# Patient Record
Sex: Female | Born: 1945 | Hispanic: No | Smoking: Former smoker
Health system: Southern US, Community
[De-identification: ages and names within clinical notes are randomized; demographics above are authoritative.]

## PROBLEM LIST (undated history)

## (undated) DIAGNOSIS — R011 Cardiac murmur, unspecified: Secondary | ICD-10-CM

## (undated) DIAGNOSIS — K219 Gastro-esophageal reflux disease without esophagitis: Secondary | ICD-10-CM

## (undated) DIAGNOSIS — K5790 Diverticulosis of intestine, part unspecified, without perforation or abscess without bleeding: Secondary | ICD-10-CM

## (undated) DIAGNOSIS — M26639 Articular disc disorder of temporomandibular joint, unspecified side: Secondary | ICD-10-CM

## (undated) DIAGNOSIS — C801 Malignant (primary) neoplasm, unspecified: Secondary | ICD-10-CM

## (undated) DIAGNOSIS — D649 Anemia, unspecified: Secondary | ICD-10-CM

## (undated) DIAGNOSIS — K589 Irritable bowel syndrome without diarrhea: Secondary | ICD-10-CM

## (undated) DIAGNOSIS — F419 Anxiety disorder, unspecified: Secondary | ICD-10-CM

## (undated) DIAGNOSIS — H269 Unspecified cataract: Secondary | ICD-10-CM

## (undated) HISTORY — PX: PAROTID GLAND TUMOR EXCISION: SHX5221

## (undated) HISTORY — PX: APPENDECTOMY: SHX54

## (undated) HISTORY — PX: SHOULDER ARTHROSCOPY: SHX128

## (undated) HISTORY — PX: TONSILLECTOMY: SUR1361

## (undated) HISTORY — PX: ABDOMINAL HYSTERECTOMY: SHX81

## (undated) HISTORY — PX: OOPHORECTOMY: SHX86

## (undated) HISTORY — DX: Cardiac murmur, unspecified: R01.1

## (undated) HISTORY — PX: CHOLECYSTECTOMY: SHX55

## (undated) HISTORY — PX: KNEE ARTHROSCOPY: SHX127

## (undated) HISTORY — DX: Irritable bowel syndrome, unspecified: K58.9

## (undated) HISTORY — DX: Anemia, unspecified: D64.9

## (undated) HISTORY — DX: Unspecified cataract: H26.9

## (undated) HISTORY — DX: Gastro-esophageal reflux disease without esophagitis: K21.9

## (undated) HISTORY — PX: COLONOSCOPY: SHX174

## (undated) HISTORY — DX: Diverticulosis of intestine, part unspecified, without perforation or abscess without bleeding: K57.90

---

## 2005-04-25 HISTORY — PX: ESOPHAGOGASTRODUODENOSCOPY: SHX1529

## 2015-05-08 DIAGNOSIS — H905 Unspecified sensorineural hearing loss: Secondary | ICD-10-CM | POA: Insufficient documentation

## 2015-05-08 DIAGNOSIS — E040 Nontoxic diffuse goiter: Secondary | ICD-10-CM | POA: Insufficient documentation

## 2015-05-08 DIAGNOSIS — F411 Generalized anxiety disorder: Secondary | ICD-10-CM | POA: Diagnosis present

## 2015-10-01 ENCOUNTER — Encounter (HOSPITAL_BASED_OUTPATIENT_CLINIC_OR_DEPARTMENT_OTHER): Payer: Self-pay | Admitting: *Deleted

## 2015-10-02 ENCOUNTER — Other Ambulatory Visit: Payer: Self-pay | Admitting: Orthopedic Surgery

## 2015-10-06 ENCOUNTER — Encounter (HOSPITAL_BASED_OUTPATIENT_CLINIC_OR_DEPARTMENT_OTHER)
Admission: RE | Disposition: A | Discharge status: Home / Self Care | Payer: Self-pay | Source: Ambulatory Visit | Attending: Orthopedic Surgery

## 2015-10-06 ENCOUNTER — Ambulatory Visit (HOSPITAL_BASED_OUTPATIENT_CLINIC_OR_DEPARTMENT_OTHER): Payer: MEDICARE | Admitting: Anesthesiology

## 2015-10-06 ENCOUNTER — Ambulatory Visit (HOSPITAL_BASED_OUTPATIENT_CLINIC_OR_DEPARTMENT_OTHER)
Admission: RE | Admit: 2015-10-06 | Discharge: 2015-10-06 | Disposition: A | Discharge status: Home / Self Care | Payer: MEDICARE | Source: Ambulatory Visit | Attending: Orthopedic Surgery | Admitting: Orthopedic Surgery

## 2015-10-06 ENCOUNTER — Encounter (HOSPITAL_BASED_OUTPATIENT_CLINIC_OR_DEPARTMENT_OTHER): Payer: Self-pay | Admitting: Certified Registered"

## 2015-10-06 DIAGNOSIS — M19041 Primary osteoarthritis, right hand: Secondary | ICD-10-CM | POA: Insufficient documentation

## 2015-10-06 DIAGNOSIS — F419 Anxiety disorder, unspecified: Secondary | ICD-10-CM | POA: Insufficient documentation

## 2015-10-06 DIAGNOSIS — M67441 Ganglion, right hand: Secondary | ICD-10-CM | POA: Diagnosis not present

## 2015-10-06 DIAGNOSIS — Z79899 Other long term (current) drug therapy: Secondary | ICD-10-CM | POA: Insufficient documentation

## 2015-10-06 HISTORY — DX: Anxiety disorder, unspecified: F41.9

## 2015-10-06 HISTORY — PX: CYST EXCISION: SHX5701

## 2015-10-06 HISTORY — DX: Malignant (primary) neoplasm, unspecified: C80.1

## 2015-10-06 HISTORY — DX: Articular disc disorder of temporomandibular joint, unspecified side: M26.639

## 2015-10-06 SURGERY — CYST REMOVAL
Anesthesia: Regional | Site: Finger | Laterality: Right

## 2015-10-06 MED ORDER — MIDAZOLAM HCL 2 MG/2ML IJ SOLN: 1.0000 mg | INTRAMUSCULAR | Status: DC | PRN

## 2015-10-06 MED ORDER — CEFAZOLIN SODIUM-DEXTROSE 2-4 GM/100ML-% IV SOLN
INTRAVENOUS | Status: AC
  Filled 2015-10-06: qty 100

## 2015-10-06 MED ORDER — SCOPOLAMINE 1 MG/3DAYS TD PT72: 1.0000 | MEDICATED_PATCH | Freq: Once | TRANSDERMAL | Status: DC | PRN

## 2015-10-06 MED ORDER — LACTATED RINGERS IV SOLN
INTRAVENOUS | Status: DC
  Administered 2015-10-06: 13:00:00 via INTRAVENOUS

## 2015-10-06 MED ORDER — HYDROCODONE-ACETAMINOPHEN 5-325 MG PO TABS: ORAL_TABLET | ORAL | Status: DC

## 2015-10-06 MED ORDER — CHLORHEXIDINE GLUCONATE 4 % EX LIQD: 60.0000 mL | Freq: Once | CUTANEOUS | Status: DC

## 2015-10-06 MED ORDER — FENTANYL CITRATE (PF) 100 MCG/2ML IJ SOLN
INTRAMUSCULAR | Status: AC
  Filled 2015-10-06: qty 2

## 2015-10-06 MED ORDER — LIDOCAINE HCL (PF) 0.5 % IJ SOLN
INTRAMUSCULAR | Status: DC | PRN
Start: 2015-10-06 — End: 2015-10-06
  Administered 2015-10-06: 30 mL via INTRAVENOUS

## 2015-10-06 MED ORDER — CEFAZOLIN SODIUM-DEXTROSE 2-4 GM/100ML-% IV SOLN
2.0000 g | INTRAVENOUS | Status: AC
  Administered 2015-10-06: 2 g via INTRAVENOUS

## 2015-10-06 MED ORDER — LIDOCAINE HCL (CARDIAC) 20 MG/ML IV SOLN
INTRAVENOUS | Status: DC | PRN
  Administered 2015-10-06: 30 mg via INTRAVENOUS

## 2015-10-06 MED ORDER — PROPOFOL 500 MG/50ML IV EMUL
INTRAVENOUS | Status: DC | PRN
  Administered 2015-10-06: 75 ug/kg/min via INTRAVENOUS

## 2015-10-06 MED ORDER — FENTANYL CITRATE (PF) 100 MCG/2ML IJ SOLN
50.0000 ug | INTRAMUSCULAR | Status: DC | PRN
  Administered 2015-10-06: 50 ug via INTRAVENOUS

## 2015-10-06 MED ORDER — GLYCOPYRROLATE 0.2 MG/ML IJ SOLN: 0.2000 mg | Freq: Once | INTRAMUSCULAR | Status: DC | PRN

## 2015-10-06 MED ORDER — BUPIVACAINE HCL (PF) 0.25 % IJ SOLN
INTRAMUSCULAR | Status: DC | PRN
  Administered 2015-10-06: 8 mL

## 2015-10-06 MED ORDER — PROPOFOL 10 MG/ML IV BOLUS
INTRAVENOUS | Status: AC
  Filled 2015-10-06: qty 20

## 2015-10-06 SURGICAL SUPPLY — 50 items
BANDAGE ACE 3X5.8 VEL STRL LF (GAUZE/BANDAGES/DRESSINGS) IMPLANT
BANDAGE COBAN STERILE 2 (GAUZE/BANDAGES/DRESSINGS) IMPLANT
BENZOIN TINCTURE PRP APPL 2/3 (GAUZE/BANDAGES/DRESSINGS) IMPLANT
BLADE MINI RND TIP GREEN BEAV (BLADE) IMPLANT
BLADE SURG 15 STRL LF DISP TIS (BLADE) ×2 IMPLANT
BLADE SURG 15 STRL SS (BLADE) ×4
BNDG COHESIVE 1X5 TAN STRL LF (GAUZE/BANDAGES/DRESSINGS) ×3 IMPLANT
BNDG CONFORM 2 STRL LF (GAUZE/BANDAGES/DRESSINGS) IMPLANT
BNDG ELASTIC 2X5.8 VLCR STR LF (GAUZE/BANDAGES/DRESSINGS) IMPLANT
BNDG ESMARK 4X9 LF (GAUZE/BANDAGES/DRESSINGS) IMPLANT
BNDG GAUZE 1X2.1 STRL (MISCELLANEOUS) IMPLANT
BNDG GAUZE ELAST 4 BULKY (GAUZE/BANDAGES/DRESSINGS) IMPLANT
BNDG PLASTER X FAST 3X3 WHT LF (CAST SUPPLIES) IMPLANT
CHLORAPREP W/TINT 26ML (MISCELLANEOUS) ×3 IMPLANT
CLOSURE WOUND 1/2 X4 (GAUZE/BANDAGES/DRESSINGS)
CORDS BIPOLAR (ELECTRODE) ×3 IMPLANT
COVER BACK TABLE 60X90IN (DRAPES) ×3 IMPLANT
COVER MAYO STAND STRL (DRAPES) ×3 IMPLANT
CUFF TOURNIQUET SINGLE 18IN (TOURNIQUET CUFF) ×3 IMPLANT
DRAPE EXTREMITY T 121X128X90 (DRAPE) ×3 IMPLANT
DRAPE SURG 17X23 STRL (DRAPES) ×3 IMPLANT
GAUZE SPONGE 4X4 12PLY STRL (GAUZE/BANDAGES/DRESSINGS) ×3 IMPLANT
GAUZE XEROFORM 1X8 LF (GAUZE/BANDAGES/DRESSINGS) ×3 IMPLANT
GLOVE BIO SURGEON STRL SZ 6.5 (GLOVE) ×2 IMPLANT
GLOVE BIO SURGEON STRL SZ7.5 (GLOVE) ×3 IMPLANT
GLOVE BIO SURGEONS STRL SZ 6.5 (GLOVE) ×1
GLOVE BIOGEL PI IND STRL 7.0 (GLOVE) ×1 IMPLANT
GLOVE BIOGEL PI IND STRL 8 (GLOVE) ×1 IMPLANT
GLOVE BIOGEL PI INDICATOR 7.0 (GLOVE) ×2
GLOVE BIOGEL PI INDICATOR 8 (GLOVE) ×2
GOWN STRL REUS W/ TWL LRG LVL3 (GOWN DISPOSABLE) ×1 IMPLANT
GOWN STRL REUS W/TWL LRG LVL3 (GOWN DISPOSABLE) ×2
NEEDLE HYPO 25X1 1.5 SAFETY (NEEDLE) ×3 IMPLANT
NS IRRIG 1000ML POUR BTL (IV SOLUTION) ×3 IMPLANT
PACK BASIN DAY SURGERY FS (CUSTOM PROCEDURE TRAY) ×3 IMPLANT
PAD CAST 3X4 CTTN HI CHSV (CAST SUPPLIES) IMPLANT
PAD CAST 4YDX4 CTTN HI CHSV (CAST SUPPLIES) IMPLANT
PADDING CAST ABS 4INX4YD NS (CAST SUPPLIES)
PADDING CAST ABS COTTON 4X4 ST (CAST SUPPLIES) IMPLANT
PADDING CAST COTTON 3X4 STRL (CAST SUPPLIES)
PADDING CAST COTTON 4X4 STRL (CAST SUPPLIES)
SPLINT FINGER 3.25 911903 (SOFTGOODS) ×3 IMPLANT
STOCKINETTE 4X48 STRL (DRAPES) ×3 IMPLANT
STRIP CLOSURE SKIN 1/2X4 (GAUZE/BANDAGES/DRESSINGS) IMPLANT
SUT ETHILON 3 0 PS 1 (SUTURE) IMPLANT
SUT ETHILON 4 0 PS 2 18 (SUTURE) ×3 IMPLANT
SYR BULB 3OZ (MISCELLANEOUS) ×3 IMPLANT
SYR CONTROL 10ML LL (SYRINGE) ×3 IMPLANT
TOWEL OR 17X24 6PK STRL BLUE (TOWEL DISPOSABLE) ×3 IMPLANT
UNDERPAD 30X30 (UNDERPADS AND DIAPERS) ×3 IMPLANT

## 2015-10-06 NOTE — Anesthesia Postprocedure Evaluation (Signed)
Anesthesia Post Note  Patient: Alexandria Chapman  Procedure(s) Performed: Procedure(s) (LRB): RIGHT LONG FINGER EXCISION MUCOID CYST (Right)  Patient location during evaluation: PACU Anesthesia Type: General Level of consciousness: awake and alert Pain management: pain level controlled Vital Signs Assessment: post-procedure vital signs reviewed and stable Respiratory status: spontaneous breathing, nonlabored ventilation and respiratory function stable Cardiovascular status: blood pressure returned to baseline and stable Postop Assessment: no signs of nausea or vomiting Anesthetic complications: no    Last Vitals:  Filed Vitals:   10/06/15 1455 10/06/15 1505  BP: 146/71 148/73  Pulse: 48   Temp:  36.4 C  Resp: 14 16    Last Pain: There were no vitals filed for this visit.               Gavriela Cashin A

## 2015-10-06 NOTE — Discharge Instructions (Addendum)

## 2015-10-06 NOTE — Transfer of Care (Signed)
Immediate Anesthesia Transfer of Care Note  Patient: Alexandria Chapman  Procedure(s) Performed: Procedure(s): RIGHT LONG FINGER EXCISION MUCOID CYST (Right)  Patient Location: PACU  Anesthesia Type:Bier block  Level of Consciousness: awake, alert , oriented and patient cooperative  Airway & Oxygen Therapy: Patient Spontanous Breathing and Patient connected to face mask oxygen  Post-op Assessment: Report given to RN and Post -op Vital signs reviewed and stable  Post vital signs: Reviewed and stable  Last Vitals:  Filed Vitals:   10/06/15 1228  BP: 129/67  Pulse: 64  Temp: 36.8 C  Resp: 16    Last Pain: There were no vitals filed for this visit.    Patients Stated Pain Goal: 2 ([id number])  Complications: No apparent anesthesia complications

## 2015-10-06 NOTE — Anesthesia Procedure Notes (Addendum)
Anesthesia Regional Block:  Bier block (IV Regional)  Pre-Anesthetic Checklist: ,, timeout performed, Correct Patient, Correct Site, Correct Laterality, Correct Procedure,, site marked, surgical consent,, at surgeon's request Needles:  Injection technique: Single-shot  Needle Type: Other      Needle Gauge: 20 and 20 G    Additional Needles: Bier block (IV Regional) Narrative:   Performed by: Personally    Procedure Name: MAC Date/Time: 10/06/2015 1:45 PM Performed by: Kannon Granderson D Pre-anesthesia Checklist: Patient identified, Emergency Drugs available, Suction available, Patient being monitored and Timeout performed Patient Re-evaluated:Patient Re-evaluated prior to inductionOxygen Delivery Method: Simple face mask

## 2015-10-06 NOTE — Brief Op Note (Signed)
10/06/2015  2:28 PM  PATIENT:  Tiajuana Amass  70 y.o. 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  PROCEDURE:  Procedure(s): RIGHT LONG FINGER EXCISION MUCOID CYST (Right)  SURGEON:  Surgeon(s) and Role:    * Leanora Cover, MD - Primary  PHYSICIAN ASSISTANT:   ASSISTANTS: none   ANESTHESIA:   Bier block with sedation  EBL:  Total I/O In: 600 [I.V.:600] Out: -   BLOOD ADMINISTERED:none  DRAINS: none   LOCAL MEDICATIONS USED:  MARCAINE     SPECIMEN:  Source of Specimen:  right long finger  DISPOSITION OF SPECIMEN:  PATHOLOGY  COUNTS:  YES  TOURNIQUET:   Total Tourniquet Time Documented: Upper Arm (Right) - 34 minutes Total: Upper Arm (Right) - 34 minutes   DICTATION: .Other Dictation: Dictation Number [id number]  PLAN OF CARE: Discharge to home after PACU  PATIENT DISPOSITION:  PACU - hemodynamically stable.

## 2015-10-06 NOTE — H&P (Signed)
  Alexandria Chapman is an 70 y.o. female.   Chief Complaint: right long finger mucoid cyst HPI: 70 yo female with cyst on right long finger x 3 years.  It is bothersome to her.  It has gotten larger and caused a groove in her nail.  She wishes to have it removed.  Allergies:  Allergies  Allergen Reactions  . Sulfa Antibiotics Anaphylaxis  . Erythromycin   . Nsaids Nausea And Vomiting  . Sucralfate Nausea Only  . Sudafed [Pseudoephedrine Hcl] Other (See Comments)    dizziness    Past Medical History  Diagnosis Date  . Cancer ([place])     RLE squamous cell removed  . Anxiety   . TMJ disorder involving articular disc abnormality     Past Surgical History  Procedure Laterality Date  . Cholecystectomy    . Abdominal hysterectomy    . Appendectomy    . Tonsillectomy    . Shoulder arthroscopy    . Oophorectomy    . Knee arthroscopy      Family History: History reviewed. No pertinent family history.  Social History:   reports that she has never smoked. She has never used smokeless tobacco. She reports that she drinks alcohol. She reports that she does not use illicit drugs.  Medications: Medications Prior to Admission  Medication Sig Dispense Refill  . Biotin 5 MG CAPS Take by mouth.    . cholecalciferol (VITAMIN D) 1000 units tablet Take 1,000 Units by mouth daily.    . clonazePAM (KLONOPIN) 0.5 MG tablet Take 0.5 mg by mouth 2 (two) times daily as needed for anxiety.    Marland Kitchen co-enzyme Q-10 50 MG capsule Take 50 mg by mouth daily.    . OMEGA 3-6-9 FATTY ACIDS PO Take by mouth.    . Probiotic Product (ALIGN PO) Take by mouth.      No results found for this or any previous visit (from the past 48 hour(s)).  No results found.   A comprehensive review of systems was negative.  Blood pressure 129/67, pulse 64, temperature 98.2 F (36.8 C), temperature source Oral, resp. rate 16, height 5\' 6"  (1.676 m), weight 57.607 kg (127 lb), SpO2 100 %.  General appearance: alert,  cooperative and appears stated age Head: Normocephalic, without obvious abnormality, atraumatic Neck: supple, symmetrical, trachea midline Resp: clear to auscultation bilaterally Cardio: regular rate and rhythm GI: non-tender Extremities: Intact sensation and capillary refill all digits.  +epl/fpl/io.  No wounds.  Pulses: 2+ and symmetric Skin: Skin color, texture, turgor normal. 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  Non operative and operative treatment options were discussed with the patient and patient wishes to proceed with operative treatment. Discussed possible need for rotation flap.  Risks, benefits, and alternatives of surgery were discussed and the patient agrees with the plan of care.   Locke Barrell R 10/06/2015, 1:36 PM

## 2015-10-06 NOTE — Op Note (Addendum)
Alexandria Chapman, Alexandria Chapman               ACCOUNT NO.:  [id number]  MEDICAL RECORD NO.:  [id number]  LOCATION:                                 FACILITY:  PHYSICIAN:  Leanora Cover, MD             DATE OF BIRTH:  DATE OF PROCEDURE:  10/06/2015 DATE OF DISCHARGE:                              OPERATIVE REPORT   PREOPERATIVE DIAGNOSIS:  Right long finger mucoid cyst and DIP joint arthritis.  POSTOPERATIVE DIAGNOSIS:  Right long finger mucoid cyst and DIP joint arthritis.  PROCEDURE:   1. Right long finger excision of mucoid cyst 2. Right long finger debridement of DIP joint including osteophytes from dorsal aspect of the distal phalanx 3. Right long finger rotation flap less than 10 square cm for soft tissue coverage.  SURGEON:  Leanora Cover, MD.  ASSISTANT:  None.  ANESTHESIA:  Bier block with sedation.  IV FLUIDS:  Per anesthesia flow sheet.  ESTIMATED BLOOD LOSS:  Minimal.  COMPLICATIONS:  None.  SPECIMENS:  Mucoid cyst to Pathology.  TOURNIQUET TIME:  34 minutes.  DISPOSITION:  Stable to PACU.  INDICATIONS FOR PROCEDURE:  The patient is a 70 year old who has had a mass on her right long finger for approximately 2-3 years.  This is bothersome to her.  She wished to have it removed.  Risks, benefits, and alternatives of the surgery were discussed including the risk of blood loss; infection; damage to nerves, vessels, tendons, ligaments, bone; failure of surgery; need for additional surgery; complications with wound healing, continued pain, recurrence of mass.  She voiced understanding of these risks and elected to proceed.  OPERATIVE COURSE:  After being identified preoperatively by myself, the patient and I agreed upon the procedure and site of the procedure. Surgical site was marked.  The risks, benefits, and alternatives of surgery were reviewed, and she wished to proceed.  Surgical consent had been signed.  We discussed the possible need for rotation flap for  skin coverage.  She was given IV Ancef as preoperative antibiotic prophylaxis.  She was transferred to the operating room and placed on the operating table in supine position with the right upper extremity on arm board.  Bier block anesthesia was induced by Anesthesiology.  Right upper extremity was prepped and draped in normal sterile orthopedic fashion.  Surgical pause was performed between surgeons, Anesthesia, operating staff; and all were in agreement as to the patient, procedure, and site of procedure.  Tourniquet at the proximal aspect of the forearm had been inflated for the Bier block.  A hockey stick shaped incision was made at the dorsum of the long finger just proximal to the cyst. This was carried into subcutaneous tissues by spreading technique.  The cyst was identified.  The area was full of clear gelatinous fluid.  The cyst and its wall were removed and were sent to Pathology for examination.  The DIP joint was entered underneath the extensor tendon. It was debrided with a synovectomy rongeur.  There was some osteophyte at the dorsal proximal aspect of the distal phalanx which was taken down.  This was removed.  The skin overlying the cyst was very thin  and dry and appeared nonviable.  It was felt a rotation flap was appropriate.  The skin incision was extended proximally and then transversely again.  The flap was then mobilized preserving the vascular inflow and outflow.  The flap was able to cover the area of the cyst. The skin distally was removed sharply with the knife to provide a good contour and fit of the rotation flap.  The wound and DIP joint were then copiously irrigated with sterile saline.  The skin edges were reapproximated with 4-0 nylon in a horizontal mattress fashion.  This provided good tension free re-apposition of the tissues.  A digital block was performed with 8 mL of 0.25% plain Marcaine to aid in postoperative analgesia.  The wound was dressed with  sterile Xeroform, 4x4s, and wrapped with Coban dressing lightly.  Alumafoam splint was placed and wrapped lightly with Coban dressing.  Tourniquet was deflated at 34 minutes.  Fingertips were pink with brisk capillary refill after deflation of the tourniquet.  Operative drapes were broken down.  The patient was awoken from anesthesia safely.  She was transferred back to stretcher and taken to PACU in stable condition.  I will see her back in the office in one week for postoperative followup.  I will give him Norco 5/325, 1-2 p.o. q.6 hours p.r.n. pain, dispensed #20.     Leanora Cover, MD     KK/MEDQ  D:  10/06/2015  T:  10/06/2015  Job:  [id number]  Addendum (10/08/15): size of flap added to note.

## 2015-10-06 NOTE — Anesthesia Preprocedure Evaluation (Signed)
Anesthesia Evaluation  Patient identified by MRN, date of birth, ID band Patient awake    Reviewed: Allergy & Precautions, NPO status , Patient's Chart, lab work & pertinent test results  Airway Mallampati: I  TM Distance: >3 FB Neck ROM: Full    Dental  (+) Teeth Intact, Dental Advisory Given   Pulmonary    breath sounds clear to auscultation       Cardiovascular  Rhythm:Regular Rate:Normal     Neuro/Psych    GI/Hepatic   Endo/Other    Renal/GU      Musculoskeletal   Abdominal   Peds  Hematology   Anesthesia Other Findings   Reproductive/Obstetrics                             Anesthesia Physical Anesthesia Plan  ASA: I  Anesthesia Plan: Bier Block   Post-op Pain Management:    Induction: Intravenous  Airway Management Planned: Simple Face Mask  Additional Equipment:   Intra-op Plan:   Post-operative Plan:   Informed Consent: I have reviewed the patients History and Physical, chart, labs and discussed the procedure including the risks, benefits and alternatives for the proposed anesthesia with the patient or authorized representative who has indicated his/her understanding and acceptance.   Dental advisory given  Plan Discussed with: CRNA, Anesthesiologist and Surgeon  Anesthesia Plan Comments:         Anesthesia Quick Evaluation

## 2015-10-06 NOTE — Op Note (Signed)
[id number] 

## 2015-10-07 ENCOUNTER — Encounter (HOSPITAL_BASED_OUTPATIENT_CLINIC_OR_DEPARTMENT_OTHER): Payer: Self-pay | Admitting: Orthopedic Surgery

## 2015-10-14 DIAGNOSIS — K5730 Diverticulosis of large intestine without perforation or abscess without bleeding: Secondary | ICD-10-CM | POA: Insufficient documentation

## 2016-11-10 DIAGNOSIS — K219 Gastro-esophageal reflux disease without esophagitis: Secondary | ICD-10-CM | POA: Diagnosis present

## 2016-12-28 ENCOUNTER — Encounter (HOSPITAL_COMMUNITY): Payer: Self-pay | Admitting: Psychiatry

## 2016-12-28 ENCOUNTER — Ambulatory Visit (INDEPENDENT_AMBULATORY_CARE_PROVIDER_SITE_OTHER): Payer: MEDICARE | Admitting: Psychiatry

## 2016-12-28 VITALS — BP 140/78 | HR 67 | Ht 65.0 in | Wt 125.2 lb

## 2016-12-28 DIAGNOSIS — Z91411 Personal history of adult psychological abuse: Secondary | ICD-10-CM

## 2016-12-28 DIAGNOSIS — F4310 Post-traumatic stress disorder, unspecified: Secondary | ICD-10-CM

## 2016-12-28 DIAGNOSIS — Z91410 Personal history of adult physical and sexual abuse: Secondary | ICD-10-CM

## 2016-12-28 DIAGNOSIS — Z62810 Personal history of physical and sexual abuse in childhood: Secondary | ICD-10-CM | POA: Diagnosis not present

## 2016-12-28 DIAGNOSIS — F5104 Psychophysiologic insomnia: Secondary | ICD-10-CM

## 2016-12-28 DIAGNOSIS — Z62811 Personal history of psychological abuse in childhood: Secondary | ICD-10-CM

## 2016-12-28 DIAGNOSIS — Z811 Family history of alcohol abuse and dependence: Secondary | ICD-10-CM | POA: Diagnosis not present

## 2016-12-28 DIAGNOSIS — Z818 Family history of other mental and behavioral disorders: Secondary | ICD-10-CM

## 2016-12-28 DIAGNOSIS — Z813 Family history of other psychoactive substance abuse and dependence: Secondary | ICD-10-CM | POA: Diagnosis not present
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
## 2016-12-28 NOTE — Patient Instructions (Signed)
Start melatonin 1 mg at sun-down for sleep  Taper clonazepam to 1/4 of a tablet for another week then stop

## 2017-01-05 ENCOUNTER — Telehealth (HOSPITAL_COMMUNITY): Payer: Self-pay

## 2017-01-05 NOTE — Telephone Encounter (Signed)
Patient is calling because she said she had a panic attack yesterday, she states she went to her PCP but she also took a 0.5 mg Klonopin. Patient states that she knows that you want her to stop the Klonopin, but wants to know if she can still take it when she suffers a panic attack. Please review and advise, thank you

## 2017-01-05 NOTE — Telephone Encounter (Signed)
Sure that is fine.

## 2017-01-30 ENCOUNTER — Telehealth (HOSPITAL_COMMUNITY): Payer: Self-pay

## 2017-01-30 MED ORDER — ESCITALOPRAM OXALATE 5 MG PO TABS: 5.0000 mg | ORAL_TABLET | Freq: Every day | ORAL | 2 refills | Status: DC

## 2017-01-30 NOTE — Telephone Encounter (Signed)
I sent the Lexapro to the pharmacy and called patient to let her know

## 2017-01-30 NOTE — Telephone Encounter (Signed)
I think she would be a good candidate for an SSRI.  Given her age, we can start fairly low. If she is interested in taking a daily medication that will help control anxiety and sleep issues, then we can start lexapro 5 mg daily.  I have never prescribed her klonipin and when she came to me, she was essentially finished in tapering it off.  I think she did the right thing for herself and I would like to help her avoid restarting clonazepam

## 2017-01-30 NOTE — Telephone Encounter (Signed)
Patient is calling because she needs to go back on her Klonopin - She suffered a panic attack right before going on vacation and her PCP gave her 10 tabs to get her through. She states that she only uses them for anxiety and now she is also having trouble sleeping. Please review and advise, thank you.  P.S. She has an appointment in December, it was your first available.

## 2017-02-15 DIAGNOSIS — K589 Irritable bowel syndrome without diarrhea: Secondary | ICD-10-CM

## 2017-02-15 DIAGNOSIS — K581 Irritable bowel syndrome with constipation: Secondary | ICD-10-CM

## 2017-02-22 ENCOUNTER — Other Ambulatory Visit (HOSPITAL_COMMUNITY): Payer: Self-pay

## 2017-02-22 MED ORDER — ESCITALOPRAM OXALATE 5 MG PO TABS: 5.0000 mg | ORAL_TABLET | Freq: Every day | ORAL | 0 refills | Status: DC

## 2017-03-28 ENCOUNTER — Encounter (HOSPITAL_COMMUNITY): Payer: Self-pay | Admitting: Psychiatry

## 2017-03-28 ENCOUNTER — Ambulatory Visit (INDEPENDENT_AMBULATORY_CARE_PROVIDER_SITE_OTHER): Payer: MEDICARE | Admitting: Psychiatry

## 2017-03-28 VITALS — BP 122/74 | HR 59 | Ht 66.0 in | Wt 128.4 lb

## 2017-03-28 DIAGNOSIS — Z813 Family history of other psychoactive substance abuse and dependence: Secondary | ICD-10-CM | POA: Diagnosis not present

## 2017-03-28 DIAGNOSIS — Z79899 Other long term (current) drug therapy: Secondary | ICD-10-CM | POA: Diagnosis not present

## 2017-03-28 DIAGNOSIS — Z811 Family history of alcohol abuse and dependence: Secondary | ICD-10-CM

## 2017-03-28 DIAGNOSIS — Z818 Family history of other mental and behavioral disorders: Secondary | ICD-10-CM | POA: Diagnosis not present

## 2017-03-28 DIAGNOSIS — F339 Major depressive disorder, recurrent, unspecified: Secondary | ICD-10-CM

## 2017-03-28 MED ORDER — VORTIOXETINE HBR 5 MG PO TABS: 5.0000 mg | ORAL_TABLET | Freq: Every day | ORAL | 3 refills | Status: DC

## 2017-03-28 NOTE — Progress Notes (Signed)
[facility] MD/PA/NP OP Progress Note  03/28/2017 10:57 AM Alexandria Chapman  MRN:  [id number]  Chief Complaint: Much better HPI: Patient reports that she is not been using clonazepam at all and is sleeping very well, Lexapro has been substantially beneficial for her anxiety and mood, she feels more cheerful, better able to tolerate frustration.  The one concern she has is related to feeling more inattentive and like her thinking is a bit slower.  We discussed switching her to vortioxetine given the approval for depression and cognitive processing speed.  Reviewed the risks of nausea and GI upset, and encouraged her to take the medicine with food.  We agreed to start at 5 mg daily and maintain it.  We will follow-up in 3 months or sooner if needed.   Visit Diagnosis:    ICD-10-CM   1. Recurrent major depressive disorder, remission status unspecified (HCC) F33.9 vortioxetine HBr (TRINTELLIX) 5 MG TABS    Past Psychiatric History: See intake H&P for full details. Reviewed, with no updates at this time.   Past Medical History:  Past Medical History:  Diagnosis Date  . Anxiety   . 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 TMJ disorder involving articular disc abnormality     Past Surgical History:  Procedure Laterality Date  . ABDOMINAL HYSTERECTOMY    . APPENDECTOMY    . CHOLECYSTECTOMY    . CYST EXCISION Right 10/06/2015   Procedure: RIGHT LONG FINGER EXCISION MUCOID CYST;  Surgeon: Leanora Cover, MD;  Location: [facility];  Service: Orthopedics;  Laterality: Right;  Bier block  . KNEE ARTHROSCOPY    . OOPHORECTOMY    . SHOULDER ARTHROSCOPY    . TONSILLECTOMY      Family Psychiatric History: See intake H&P for full details. Reviewed, with no updates at this time.   Family History:  Family History  Problem Relation Age of Onset  . Alcohol abuse Mother   . Bipolar disorder Mother   . Alcohol abuse Maternal Uncle   . Drug abuse Cousin   . OCD Other     Social History:   Social History   Socioeconomic History  . Marital status: Widowed    Spouse name: None  . Number of children: None  . Years of education: None  . Highest education level: None  Social Needs  . Financial resource strain: None  . Food insecurity - worry: None  . Food insecurity - inability: None  . Transportation needs - medical: None  . Transportation needs - non-medical: None  Occupational History  . None  Tobacco Use  . Smoking status: Never Smoker  . Smokeless tobacco: Never Used  Substance and Sexual Activity  . Alcohol use: Yes    Comment: occas  . Drug use: No  . Sexual activity: No  Other Topics Concern  . None  Social History Narrative  . None    Allergies:  Allergies  Allergen Reactions  . Sulfa Antibiotics Anaphylaxis  . Align [Acidophilus] Other (See Comments)    Constipation and gas  . Erythromycin   . Nsaids Nausea And Vomiting  . Sucralfate Nausea Only  . Sudafed [Pseudoephedrine Hcl] Other (See Comments)    dizziness    Metabolic Disorder Labs: No results found for: HGBA1C, MPG No results found for: PROLACTIN No results found for: CHOL, TRIG, HDL, CHOLHDL, VLDL, LDLCALC No results found for: TSH  Therapeutic Level Labs: No results found for: LITHIUM No results found for: VALPROATE No components  found for:  CBMZ  Current Medications: Current Outpatient Medications  Medication Sig Dispense Refill  . ALOE VERA JUICE LIQD Take 2 oz by mouth.    . Biotin 5 MG CAPS Take by mouth.    . Calcium-Magnesium-Vitamin D (CALCIUM 500 PO) Take 500 mg by mouth.    . cholecalciferol (VITAMIN D) 1000 units tablet Take 1,000 Units by mouth daily.    Marland Kitchen co-enzyme Q-10 50 MG capsule Take 50 mg by mouth daily.    . COD LIVER OIL PO Take 228 mg by mouth.    . cyanocobalamin 500 MCG tablet Take 500 mcg by mouth daily.    . Magnesium 250 MG TABS Take 250 mg by mouth.    . OMEGA 3-6-9 FATTY ACIDS PO Take by mouth.    . pantoprazole (PROTONIX) 40 MG tablet Take 40  mg by mouth daily.    . polyethylene glycol (MIRALAX / GLYCOLAX) packet Take 17 g by mouth daily.    . Probiotic Product (ALIGN PO) Take by mouth.    . vortioxetine HBr (TRINTELLIX) 5 MG TABS Take 1 tablet (5 mg total) by mouth daily. 30 tablet 3   No current facility-administered medications for this visit.      Musculoskeletal: Strength & Muscle Tone: within normal limits Gait & Station: normal Patient leans: N/A  Psychiatric Specialty Exam: ROS  Blood pressure 122/74, pulse (!) 59, height 5\' 6"  (1.676 m), weight 128 lb 6.4 oz (58.2 kg).Body mass index is 20.72 kg/m.  General Appearance: Casual and Well Groomed  Eye Contact:  Good  Speech:  Clear and Coherent  Volume:  Normal  Mood:  Euthymic  Affect:  Appropriate and Congruent  Thought Process:  Coherent, Goal Directed and Descriptions of Associations: Intact  Orientation:  Full (Time, Place, and Person)  Thought Content: Logical   Suicidal Thoughts:  No  Homicidal Thoughts:  No  Memory:  Immediate;   Good  Judgement:  Good  Insight:  Good  Psychomotor Activity:  Normal  Concentration:  Concentration: Fair  Recall:  Good  Fund of Knowledge: Good  Language: Good  Akathisia:  Negative  Handed:  Right  AIMS (if indicated): not done  Assets:  Communication Skills Desire for Improvement Financial Resources/Insurance Housing Leisure Time [place] Talents/Skills Transportation Vocational/Educational  ADL's:  Intact  Cognition: WNL  Sleep:  Good   Screenings:   Assessment and Plan:  Alexandria Chapman presents with general remission of her symptoms on Lexapro 5 mg daily, except for some mild worsening of inattentive symptoms in feeling more cognitively sluggish.  We discussed a switch to vortioxetine 5 mg daily and will follow up in 3 months.  I encouraged her to take the medicine with food to reduce the risk of nausea.  She is not using any benzodiazepines and reports that her sleep  has improved substantially.  1. Recurrent major depressive disorder, remission status unspecified ([place])     Status of current problems: gradually improving  Labs Ordered: No orders of the defined types were placed in this encounter.   Labs Reviewed: N/A  Collateral Obtained/Records Reviewed: N/A  Plan:  Discontinue Lexapro Vortioxetine 5 mg daily Clonazepam no longer taken    I spent 15 minutes with the patient in direct face-to-face clinical care.  Greater than 50% of this time was spent in counseling and coordination of care with the patient.    Aundra Dubin, MD 03/28/2017, 10:57 AM

## 2017-06-16 DIAGNOSIS — M7541 Impingement syndrome of right shoulder: Secondary | ICD-10-CM | POA: Insufficient documentation

## 2017-06-26 ENCOUNTER — Ambulatory Visit (INDEPENDENT_AMBULATORY_CARE_PROVIDER_SITE_OTHER): Payer: MEDICARE | Admitting: Psychiatry

## 2017-06-26 ENCOUNTER — Encounter (HOSPITAL_COMMUNITY): Payer: Self-pay | Admitting: Psychiatry

## 2017-06-26 VITALS — BP 145/79 | HR 58 | Ht 66.0 in | Wt 134.0 lb

## 2017-06-26 DIAGNOSIS — Z811 Family history of alcohol abuse and dependence: Secondary | ICD-10-CM | POA: Diagnosis not present

## 2017-06-26 DIAGNOSIS — G2581 Restless legs syndrome: Secondary | ICD-10-CM | POA: Diagnosis not present

## 2017-06-26 DIAGNOSIS — Z813 Family history of other psychoactive substance abuse and dependence: Secondary | ICD-10-CM

## 2017-06-26 DIAGNOSIS — Z818 Family history of other mental and behavioral disorders: Secondary | ICD-10-CM

## 2017-06-26 DIAGNOSIS — R002 Palpitations: Secondary | ICD-10-CM | POA: Diagnosis not present

## 2017-06-26 DIAGNOSIS — F5101 Primary insomnia: Secondary | ICD-10-CM

## 2017-06-26 MED ORDER — ALPRAZOLAM 0.25 MG PO TABS: 0.2500 mg | ORAL_TABLET | Freq: Every evening | ORAL | 2 refills | Status: DC | PRN

## 2017-06-26 NOTE — Progress Notes (Signed)
Patient takes PRN

## 2017-06-26 NOTE — Progress Notes (Signed)
[facility] MD/PA/NP OP Progress Note  06/26/2017 11:15 AM Alexandria Chapman  MRN:  [id number]  Chief Complaint: Trintellix side effects HPI: Alexandria Chapman presents for med management follow-up.  She reports that Trintellix cause a lot of side effects for her, and she stopped after 5 weeks.  She had GI side effects, palpitations, insomnia, restless leg.  She reports that she can deal with the mood aspects, but she would primarily like to work on insomnia.  She feels like if she got a good night sleep, things during the day would be generally much easier to deal with.  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  Clonazepam has been effective in the past for sleep onset insomnia for her.  I spent time discussing the risks of clonazepam given its long acting profile.  I suggested we use a very short acting benzodiazepine such as Xanax which can be in an out of her system very quickly.  She was agreeable to a trial of this and understands the long-term risks of benzodiazepines in the elderly.  Educated her that this should not be taken during the day as it would still increase the risk of falling and confusion.  Visit Diagnosis:    ICD-10-CM   1. Primary insomnia F51.01 ALPRAZolam (XANAX) 0.25 MG tablet    Past Psychiatric History: See intake H&P for full details. Reviewed, with no updates at this time.   Past Medical History:  Past Medical History:  Diagnosis Date  . Anxiety   . 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 TMJ disorder involving articular disc abnormality     Past Surgical History:  Procedure Laterality Date  . ABDOMINAL HYSTERECTOMY    . APPENDECTOMY    . CHOLECYSTECTOMY    . CYST EXCISION Right 10/06/2015   Procedure: RIGHT LONG FINGER EXCISION MUCOID CYST;  Surgeon: Leanora Cover, MD;  Location: [facility];  Service: Orthopedics;  Laterality: Right;  Bier block  . KNEE ARTHROSCOPY    . OOPHORECTOMY    . SHOULDER ARTHROSCOPY    . TONSILLECTOMY      Family Psychiatric  History: See intake H&P for full details. Reviewed, with no updates at this time.   Family History:  Family History  Problem Relation Age of Onset  . Alcohol abuse Mother   . Bipolar disorder Mother   . Alcohol abuse Maternal Uncle   . Drug abuse Cousin   . OCD Other     Social History:  Social History   Socioeconomic History  . Marital status: Widowed    Spouse name: None  . Number of children: None  . Years of education: None  . Highest education level: None  Social Needs  . Financial resource strain: None  . Food insecurity - worry: None  . Food insecurity - inability: None  . Transportation needs - medical: None  . Transportation needs - non-medical: None  Occupational History  . None  Tobacco Use  . Smoking status: Never Smoker  . Smokeless tobacco: Never Used  Substance and Sexual Activity  . Alcohol use: Yes    Comment: occas  . Drug use: No  . Sexual activity: No  Other Topics Concern  . None  Social History Narrative  . None    Allergies:  Allergies  Allergen Reactions  . Sulfa Antibiotics Anaphylaxis  . Align [Acidophilus] Other (See Comments)    Constipation and gas  . Erythromycin   . Nsaids Nausea And Vomiting  . Sucralfate Nausea  Only  . Sudafed [Pseudoephedrine Hcl] Other (See Comments)    dizziness    Metabolic Disorder Labs: No results found for: HGBA1C, MPG No results found for: PROLACTIN No results found for: CHOL, TRIG, HDL, CHOLHDL, VLDL, LDLCALC No results found for: TSH  Therapeutic Level Labs: No results found for: LITHIUM No results found for: VALPROATE No components found for:  CBMZ  Current Medications: Current Outpatient Medications  Medication Sig Dispense Refill  . Calcium-Magnesium-Vitamin D (CALCIUM 500 PO) Take 500 mg by mouth.    . cholecalciferol (VITAMIN D) 1000 units tablet Take 1,000 Units by mouth daily.    . COD LIVER OIL PO Take 228 mg by mouth.    . Magnesium 250 MG TABS Take 250 mg by mouth.    .  OMEGA 3-6-9 FATTY ACIDS PO Take by mouth.    . polyethylene glycol (MIRALAX / GLYCOLAX) packet Take 17 g by mouth daily.    . Probiotic Product (ALIGN PO) Take by mouth.    . Selenium 200 MCG CAPS Take by mouth.    . ALOE VERA JUICE LIQD Take 2 oz by mouth.    . ALPRAZolam (XANAX) 0.25 MG tablet Take 1 tablet (0.25 mg total) by mouth at bedtime as needed for anxiety or sleep. 30 tablet 2  . Biotin 5 MG CAPS Take by mouth.    . co-enzyme Q-10 50 MG capsule Take 50 mg by mouth daily.    . cyanocobalamin 500 MCG tablet Take 500 mcg by mouth daily.    . pantoprazole (PROTONIX) 40 MG tablet Take 40 mg by mouth daily.     No current facility-administered medications for this visit.      Musculoskeletal: Strength & Muscle Tone: within normal limits Gait & Station: normal Patient leans: N/A  Psychiatric Specialty Exam: ROS  Blood pressure (!) 145/79, pulse (!) 58, height 5\' 6"  (1.676 m), weight 134 lb (60.8 kg).Body mass index is 21.63 kg/m.  General Appearance: Casual and Fairly Groomed  Eye Contact:  Good  Speech:  Clear and Coherent and Normal Rate  Volume:  Normal  Mood:  Euthymic  Affect:  Appropriate and Congruent  Thought Process:  Goal Directed and Descriptions of Associations: Intact  Orientation:  Full (Time, Place, and Person)  Thought Content: Logical   Suicidal Thoughts:  No  Homicidal Thoughts:  No  Memory:  Immediate;   Good  Judgement:  Good  Insight:  Good  Psychomotor Activity:  Normal  Concentration:  Concentration: Good  Recall:  Good  Fund of Knowledge: Good  Language: Good  Akathisia:  Negative  Handed:  Right  AIMS (if indicated): not done  Assets:  Communication Skills Desire for Improvement Financial Resources/Insurance Housing Leisure Time [place] Talents/Skills Transportation  ADL's:  Intact  Cognition: WNL  Sleep:  Good   Screenings:   Assessment and Plan:  Alexandria Chapman reports that her mood has  been okay even in the absence of Trintellix.  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  We discussed the use of a very short acting benzodiazepine, alprazolam, to address her sleep onset symptoms.  She has previously tried melatonin without benefit, and had previous benefit from clonazepam, but we both have concerns about her being on a long-acting benzodiazepine given the risk of falls.  She does not have any personal history of falling or difficulties with her gait, but she  understands that her age is a risk factor.  She does not present any acute safety concerns at this time.  We will follow-up in 3 months or sooner if needed.  1. Primary insomnia     Status of current problems: stable  Labs Ordered: No orders of the defined types were placed in this encounter.   Labs Reviewed: N/A  Collateral Obtained/Records Reviewed: N/A  Plan:  Alprazolam 0.25 mg nightly for sleep Return to clinic in 12 weeks  I spent 20 minutes with the patient in direct face-to-face clinical care.  Greater than 50% of this time was spent in counseling and coordination of care with the patient.    Aundra Dubin, MD 06/26/2017, 11:15 AM

## 2017-09-14 ENCOUNTER — Encounter (HOSPITAL_COMMUNITY): Payer: Self-pay | Admitting: Psychiatry

## 2017-09-14 ENCOUNTER — Ambulatory Visit (INDEPENDENT_AMBULATORY_CARE_PROVIDER_SITE_OTHER): Payer: MEDICARE | Admitting: Psychiatry

## 2017-09-14 DIAGNOSIS — Z818 Family history of other mental and behavioral disorders: Secondary | ICD-10-CM | POA: Diagnosis not present

## 2017-09-14 DIAGNOSIS — F5101 Primary insomnia: Secondary | ICD-10-CM | POA: Diagnosis not present

## 2017-09-14 DIAGNOSIS — Z811 Family history of alcohol abuse and dependence: Secondary | ICD-10-CM

## 2017-09-14 MED ORDER — ALPRAZOLAM 0.25 MG PO TABS: 0.2500 mg | ORAL_TABLET | Freq: Every evening | ORAL | 2 refills | Status: DC | PRN

## 2017-09-14 NOTE — Progress Notes (Signed)
[facility] MD/PA/NP OP Progress Note  09/14/2017 10:58 AM Alexandria Chapman  MRN:  [id number]  Chief Complaint: med management  HPI: Alexandria Chapman reports that her mood and anxiety have been doing fine, she is sleeping very well with the low-dose of alprazolam.  She has been making very positive improvements in her life and socializing more often, developing more meaningful relationships, connecting more with her son in [place].  She feels like things are on a very positive trajectory and this has largely to do with her steps that she has taken in being more vulnerable and open and honest with the individuals in her life.  No significant concerns about her medication or safety issues.  We will follow-up in 3 months, and she understands that writer is transitioning out of clinic at that time.  Visit Diagnosis:    ICD-10-CM   1. Primary insomnia F51.01 ALPRAZolam (XANAX) 0.25 MG tablet    Past Psychiatric History: See intake H&P for full details. Reviewed, with no updates at this time.  Past Medical History:  Past Medical History:  Diagnosis Date  . Anxiety   . 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 TMJ disorder involving articular disc abnormality     Past Surgical History:  Procedure Laterality Date  . ABDOMINAL HYSTERECTOMY    . APPENDECTOMY    . CHOLECYSTECTOMY    . CYST EXCISION Right 10/06/2015   Procedure: RIGHT LONG FINGER EXCISION MUCOID CYST;  Surgeon: Leanora Cover, MD;  Location: [facility];  Service: Orthopedics;  Laterality: Right;  Bier block  . KNEE ARTHROSCOPY    . OOPHORECTOMY    . SHOULDER ARTHROSCOPY    . TONSILLECTOMY      Family Psychiatric History: See intake H&P for full details. Reviewed, with no updates at this time.   Family History:  Family History  Problem Relation Age of Onset  . Alcohol abuse Mother   . Bipolar disorder Mother   . Alcohol abuse Maternal Uncle   . Drug abuse Cousin   . OCD Other     Social History:  Social History    Socioeconomic History  . Marital status: Widowed    Spouse name: Not on file  . Number of children: Not on file  . Years of education: Not on file  . Highest education level: Not on file  Occupational History  . Not on file  Social Needs  . Financial resource strain: Not on file  . Food insecurity:    Worry: Not on file    Inability: Not on file  . Transportation needs:    Medical: Not on file    Non-medical: Not on file  Tobacco Use  . Smoking status: Never Smoker  . Smokeless tobacco: Never Used  Substance and Sexual Activity  . Alcohol use: Yes    Comment: occas  . Drug use: No  . Sexual activity: Never  Lifestyle  . Physical activity:    Days per week: Not on file    Minutes per session: Not on file  . Stress: Not on file  Relationships  . Social connections:    Talks on phone: Not on file    Gets together: Not on file    Attends religious service: Not on file    Active member of club or organization: Not on file    Attends meetings of clubs or organizations: Not on file    Relationship status: Not on file  Other Topics Concern  .  Not on file  Social History Narrative  . Not on file    Allergies:  Allergies  Allergen Reactions  . Sulfa Antibiotics Anaphylaxis  . Align [Acidophilus] Other (See Comments)    Constipation and gas  . Erythromycin   . Nsaids Nausea And Vomiting  . Sucralfate Nausea Only  . Sudafed [Pseudoephedrine Hcl] Other (See Comments)    dizziness    Metabolic Disorder Labs: No results found for: HGBA1C, MPG No results found for: PROLACTIN No results found for: CHOL, TRIG, HDL, CHOLHDL, VLDL, LDLCALC No results found for: TSH  Therapeutic Level Labs: No results found for: LITHIUM No results found for: VALPROATE No components found for:  CBMZ  Current Medications: Current Outpatient Medications  Medication Sig Dispense Refill  . ALPRAZolam (XANAX) 0.25 MG tablet Take 1 tablet (0.25 mg total) by mouth at bedtime as needed for  anxiety or sleep. 30 tablet 2  . Calcium-Magnesium-Vitamin D (CALCIUM 500 PO) Take 500 mg by mouth.    . cholecalciferol (VITAMIN D) 1000 units tablet Take 1,000 Units by mouth daily.    Marland Kitchen co-enzyme Q-10 50 MG capsule Take 50 mg by mouth daily.    . COD LIVER OIL PO Take 228 mg by mouth.    . cyanocobalamin 500 MCG tablet Take 500 mcg by mouth daily.    Marland Kitchen dicyclomine (BENTYL) 20 MG tablet Take 20 mg by mouth 1 day or 1 dose.    . Magnesium 250 MG TABS Take 250 mg by mouth.    . OMEGA 3-6-9 FATTY ACIDS PO Take by mouth.    . polyethylene glycol (MIRALAX / GLYCOLAX) packet Take 17 g by mouth daily.    . Probiotic Product (ALIGN PO) Take by mouth.    . ALOE VERA JUICE LIQD Take 2 oz by mouth.    . Biotin 5 MG CAPS Take by mouth.    . pantoprazole (PROTONIX) 40 MG tablet Take 40 mg by mouth daily.    . Selenium 200 MCG CAPS Take by mouth.     No current facility-administered medications for this visit.    Musculoskeletal: Strength & Muscle Tone: within normal limits Gait & Station: normal Patient leans: N/A  Psychiatric Specialty Exam: ROS  Blood pressure (!) 144/73, pulse (!) 58, height 5\' 6"  (1.676 m), weight 131 lb (59.4 kg), SpO2 98 %.Body mass index is 21.14 kg/m.  General Appearance: Casual and Well Groomed  Eye Contact:  Good  Speech:  Clear and Coherent and Normal Rate  Volume:  Normal  Mood:  Euthymic  Affect:  Appropriate and Congruent  Thought Process:  Goal Directed and Descriptions of Associations: Intact  Orientation:  Full (Time, Place, and Person)  Thought Content: Logical   Suicidal Thoughts:  No  Homicidal Thoughts:  No  Memory:  Immediate;   Fair  Judgement:  Fair  Insight:  Fair  Psychomotor Activity:  Normal  Concentration:  Concentration: Good  Recall:  Good  Fund of Knowledge: Good  Language: Good  Akathisia:  Negative  Handed:  Right  AIMS (if indicated): not done  Assets:  Communication Skills Desire for Improvement Financial  Resources/Insurance Housing  ADL's:  Intact  Cognition: WNL  Sleep:  Good   Screenings:    Assessment and Plan: Audryanna Zurita has had good results with a low-dose of Xanax nightly for sleep.  No concerns about education misuse, overall  feels that her memory and energy, and mood are at a good place.  We will continue as below  and follow-up in 3 months.  Disclosed to patient that Probation officer is leaving practice at the end of August, and we discussed the transition of care at that time.  Given stability on her current medication regimen, and the very low-dose of Xanax, she may be appropriate to follow-up with her primary care provider for refills and check ins every 3-4 months.  1. Primary insomnia     Status of current problems: stable  Labs Ordered: No orders of the defined types were placed in this encounter.   Labs Reviewed: n/a  Collateral Obtained/Records Reviewed: n/a  Plan:  Continue xanax 0.25 mg nightly rtc 3 months   I spent 20 minutes with the patient in direct face-to-face clinical care.  Greater than 50% of this time was spent in counseling and coordination of care with the patient.    Aundra Dubin, MD 09/14/2017, 10:58 AM

## 2017-10-29 ENCOUNTER — Inpatient Hospital Stay (HOSPITAL_BASED_OUTPATIENT_CLINIC_OR_DEPARTMENT_OTHER)
Admission: EM | Admit: 2017-10-29 | Discharge: 2017-11-05 | DRG: 392 | Disposition: A | Discharge status: Home / Self Care | Payer: MEDICARE | Attending: Surgery | Admitting: Surgery

## 2017-10-29 ENCOUNTER — Encounter (HOSPITAL_BASED_OUTPATIENT_CLINIC_OR_DEPARTMENT_OTHER): Payer: Self-pay | Admitting: Emergency Medicine

## 2017-10-29 ENCOUNTER — Emergency Department (HOSPITAL_BASED_OUTPATIENT_CLINIC_OR_DEPARTMENT_OTHER): Payer: MEDICARE

## 2017-10-29 ENCOUNTER — Other Ambulatory Visit: Payer: Self-pay

## 2017-10-29 DIAGNOSIS — K5720 Diverticulitis of large intestine with perforation and abscess without bleeding: Secondary | ICD-10-CM | POA: Diagnosis not present

## 2017-10-29 DIAGNOSIS — Z90710 Acquired absence of both cervix and uterus: Secondary | ICD-10-CM | POA: Diagnosis not present

## 2017-10-29 DIAGNOSIS — K581 Irritable bowel syndrome with constipation: Secondary | ICD-10-CM | POA: Diagnosis present

## 2017-10-29 DIAGNOSIS — Z85828 Personal history of other malignant neoplasm of skin: Secondary | ICD-10-CM | POA: Diagnosis not present

## 2017-10-29 DIAGNOSIS — F419 Anxiety disorder, unspecified: Secondary | ICD-10-CM | POA: Diagnosis present

## 2017-10-29 DIAGNOSIS — F411 Generalized anxiety disorder: Secondary | ICD-10-CM | POA: Diagnosis present

## 2017-10-29 DIAGNOSIS — R350 Frequency of micturition: Secondary | ICD-10-CM | POA: Diagnosis present

## 2017-10-29 DIAGNOSIS — E876 Hypokalemia: Secondary | ICD-10-CM | POA: Diagnosis not present

## 2017-10-29 DIAGNOSIS — Z79899 Other long term (current) drug therapy: Secondary | ICD-10-CM | POA: Diagnosis not present

## 2017-10-29 DIAGNOSIS — K219 Gastro-esophageal reflux disease without esophagitis: Secondary | ICD-10-CM | POA: Diagnosis present

## 2017-10-29 DIAGNOSIS — R3915 Urgency of urination: Secondary | ICD-10-CM | POA: Diagnosis present

## 2017-10-29 DIAGNOSIS — K589 Irritable bowel syndrome without diarrhea: Secondary | ICD-10-CM

## 2017-10-29 DIAGNOSIS — K5792 Diverticulitis of intestine, part unspecified, without perforation or abscess without bleeding: Secondary | ICD-10-CM | POA: Diagnosis present

## 2017-10-29 DIAGNOSIS — Z888 Allergy status to other drugs, medicaments and biological substances status: Secondary | ICD-10-CM | POA: Diagnosis not present

## 2017-10-29 DIAGNOSIS — G4700 Insomnia, unspecified: Secondary | ICD-10-CM | POA: Diagnosis not present

## 2017-10-29 DIAGNOSIS — Z882 Allergy status to sulfonamides status: Secondary | ICD-10-CM

## 2017-10-29 LAB — CBC
HEMATOCRIT: 38.9 % (ref 36.0–46.0)
Hemoglobin: 13.1 g/dL (ref 12.0–15.0)
MCH: 28.9 pg (ref 26.0–34.0)
MCHC: 33.7 g/dL (ref 30.0–36.0)
MCV: 85.7 fL (ref 78.0–100.0)
Platelets: 228 10*3/uL (ref 150–400)
RBC: 4.54 MIL/uL (ref 3.87–5.11)
RDW: 13.2 % (ref 11.5–15.5)
WBC: 17.1 10*3/uL — AB (ref 4.0–10.5)

## 2017-10-29 LAB — URINALYSIS, MICROSCOPIC (REFLEX)

## 2017-10-29 LAB — COMPREHENSIVE METABOLIC PANEL
ALT: 15 U/L (ref 0–44)
AST: 24 U/L (ref 15–41)
Albumin: 3.9 g/dL (ref 3.5–5.0)
Alkaline Phosphatase: 55 U/L (ref 38–126)
Anion gap: 8 (ref 5–15)
BUN: 11 mg/dL (ref 8–23)
CHLORIDE: 94 mmol/L — AB (ref 98–111)
CO2: 29 mmol/L (ref 22–32)
Calcium: 8.7 mg/dL — ABNORMAL LOW (ref 8.9–10.3)
Creatinine, Ser: 0.69 mg/dL (ref 0.44–1.00)
GFR calc Af Amer: >60 mL/min (ref >60)
Glucose, Bld: 105 mg/dL — ABNORMAL HIGH (ref 70–99)
POTASSIUM: 3.4 mmol/L — AB (ref 3.5–5.1)
Sodium: 131 mmol/L — ABNORMAL LOW (ref 135–145)
Total Bilirubin: 1.7 mg/dL — ABNORMAL HIGH (ref 0.3–1.2)
Total Protein: 7.7 g/dL (ref 6.5–8.1)

## 2017-10-29 LAB — I-STAT CG4 LACTIC ACID, ED
LACTIC ACID, VENOUS: 0.99 mmol/L (ref 0.5–1.9)
Lactic Acid, Venous: 0.82 mmol/L (ref 0.5–1.9)

## 2017-10-29 LAB — URINALYSIS, ROUTINE W REFLEX MICROSCOPIC
BILIRUBIN URINE: NEGATIVE
Glucose, UA: NEGATIVE mg/dL
Ketones, ur: NEGATIVE mg/dL
LEUKOCYTES UA: NEGATIVE
Nitrite: NEGATIVE
Protein, ur: NEGATIVE mg/dL
Specific Gravity, Urine: <1.005 — ABNORMAL LOW (ref 1.005–1.030)
pH: 7 (ref 5.0–8.0)

## 2017-10-29 LAB — LIPASE, BLOOD: LIPASE: 26 U/L (ref 11–51)

## 2017-10-29 MED ORDER — ONDANSETRON 4 MG PO TBDP: 4.0000 mg | ORAL_TABLET | Freq: Four times a day (QID) | ORAL | Status: DC | PRN

## 2017-10-29 MED ORDER — SODIUM CHLORIDE 0.9 % IV BOLUS
500.0000 mL | Freq: Once | INTRAVENOUS | Status: AC
  Administered 2017-10-29: 500 mL via INTRAVENOUS

## 2017-10-29 MED ORDER — ACETAMINOPHEN 650 MG RE SUPP: 650.0000 mg | Freq: Four times a day (QID) | RECTAL | Status: DC | PRN

## 2017-10-29 MED ORDER — ACETAMINOPHEN 325 MG PO TABS: 650.0000 mg | ORAL_TABLET | Freq: Four times a day (QID) | ORAL | Status: DC | PRN

## 2017-10-29 MED ORDER — SODIUM CHLORIDE 0.9 % IV BOLUS
1000.0000 mL | Freq: Once | INTRAVENOUS | Status: AC
  Administered 2017-10-29: 1000 mL via INTRAVENOUS

## 2017-10-29 MED ORDER — DOCUSATE SODIUM 100 MG PO CAPS
100.0000 mg | ORAL_CAPSULE | Freq: Two times a day (BID) | ORAL | Status: DC
  Administered 2017-10-29 – 2017-11-04 (×12): 100 mg via ORAL
  Filled 2017-10-29 (×11): qty 1

## 2017-10-29 MED ORDER — ONDANSETRON HCL 4 MG/2ML IJ SOLN
4.0000 mg | Freq: Four times a day (QID) | INTRAMUSCULAR | Status: DC | PRN
  Administered 2017-10-30 – 2017-11-02 (×2): 4 mg via INTRAVENOUS
  Filled 2017-10-29 (×2): qty 2

## 2017-10-29 MED ORDER — PIPERACILLIN-TAZOBACTAM 3.375 G IVPB
3.3750 g | Freq: Three times a day (TID) | INTRAVENOUS | Status: DC
  Administered 2017-10-29 – 2017-11-04 (×19): 3.375 g via INTRAVENOUS
  Filled 2017-10-29 (×21): qty 50

## 2017-10-29 MED ORDER — SIMETHICONE 80 MG PO CHEW: 40.0000 mg | CHEWABLE_TABLET | Freq: Four times a day (QID) | ORAL | Status: DC | PRN

## 2017-10-29 MED ORDER — MORPHINE SULFATE (PF) 4 MG/ML IV SOLN
4.0000 mg | Freq: Once | INTRAVENOUS | Status: AC
  Administered 2017-10-29: 4 mg via INTRAVENOUS
  Filled 2017-10-29: qty 1

## 2017-10-29 MED ORDER — METHOCARBAMOL 500 MG PO TABS
500.0000 mg | ORAL_TABLET | Freq: Four times a day (QID) | ORAL | Status: DC | PRN
  Filled 2017-10-29: qty 1

## 2017-10-29 MED ORDER — PIPERACILLIN-TAZOBACTAM 3.375 G IVPB 30 MIN
3.3750 g | Freq: Once | INTRAVENOUS | Status: AC
  Administered 2017-10-29: 3.375 g via INTRAVENOUS
  Filled 2017-10-29 (×2): qty 50

## 2017-10-29 MED ORDER — HYDRALAZINE HCL 20 MG/ML IJ SOLN: 10.0000 mg | INTRAMUSCULAR | Status: DC | PRN

## 2017-10-29 MED ORDER — PANTOPRAZOLE SODIUM 40 MG PO TBEC
40.0000 mg | DELAYED_RELEASE_TABLET | Freq: Every day | ORAL | Status: DC
  Administered 2017-10-29 – 2017-10-30 (×2): 40 mg via ORAL
  Filled 2017-10-29 (×2): qty 1

## 2017-10-29 MED ORDER — DIPHENHYDRAMINE HCL 50 MG/ML IJ SOLN
12.5000 mg | Freq: Four times a day (QID) | INTRAMUSCULAR | Status: DC | PRN
  Filled 2017-10-29: qty 1

## 2017-10-29 MED ORDER — DIPHENHYDRAMINE HCL 12.5 MG/5ML PO ELIX
12.5000 mg | ORAL_SOLUTION | Freq: Four times a day (QID) | ORAL | Status: DC | PRN
  Administered 2017-10-31 – 2017-11-04 (×5): 12.5 mg via ORAL
  Filled 2017-10-29 (×5): qty 5

## 2017-10-29 MED ORDER — ENOXAPARIN SODIUM 40 MG/0.4ML ~~LOC~~ SOLN
40.0000 mg | Freq: Every day | SUBCUTANEOUS | Status: DC
  Administered 2017-10-29 – 2017-11-03 (×6): 40 mg via SUBCUTANEOUS
  Filled 2017-10-29 (×6): qty 0.4

## 2017-10-29 MED ORDER — ONDANSETRON HCL 4 MG/2ML IJ SOLN
4.0000 mg | Freq: Once | INTRAMUSCULAR | Status: AC | PRN
  Administered 2017-10-29: 4 mg via INTRAVENOUS
  Filled 2017-10-29: qty 2

## 2017-10-29 MED ORDER — SODIUM CHLORIDE 0.9 % IV SOLN
INTRAVENOUS | Status: DC
  Administered 2017-10-29 – 2017-11-02 (×8): via INTRAVENOUS

## 2017-10-29 MED ORDER — IOPAMIDOL (ISOVUE-300) INJECTION 61%
100.0000 mL | Freq: Once | INTRAVENOUS | Status: AC | PRN
  Administered 2017-10-29: 100 mL via INTRAVENOUS

## 2017-10-29 MED ORDER — METOPROLOL TARTRATE 5 MG/5ML IV SOLN: 5.0000 mg | Freq: Four times a day (QID) | INTRAVENOUS | Status: DC | PRN

## 2017-10-29 MED ORDER — ALPRAZOLAM 0.25 MG PO TABS
0.2500 mg | ORAL_TABLET | Freq: Every evening | ORAL | Status: DC | PRN
  Administered 2017-10-29 – 2017-11-04 (×7): 0.25 mg via ORAL
  Filled 2017-10-29 (×7): qty 1

## 2017-10-29 MED ORDER — HYDROMORPHONE HCL 1 MG/ML IJ SOLN
0.5000 mg | INTRAMUSCULAR | Status: DC | PRN
  Administered 2017-11-03 (×2): 0.5 mg via INTRAVENOUS
  Filled 2017-10-29 (×3): qty 0.5

## 2017-10-29 MED ORDER — TRAMADOL HCL 50 MG PO TABS
50.0000 mg | ORAL_TABLET | Freq: Four times a day (QID) | ORAL | Status: DC | PRN
  Administered 2017-10-30 – 2017-11-01 (×5): 50 mg via ORAL
  Filled 2017-10-29 (×6): qty 1

## 2017-10-29 NOTE — ED Triage Notes (Signed)
Patient states that she has had lower pelvic pain bilateral since Friday - Patient states that she is also having N/V/D  - she reports that it hurts worse with walking as well

## 2017-10-29 NOTE — ED Notes (Signed)
[facility] arrived to transport pt 

## 2017-10-29 NOTE — ED Provider Notes (Signed)
[facility] EMERGENCY DEPARTMENT Provider Note   CSN: [id number] Arrival date & time: 10/29/17  1457     History   Chief Complaint Chief Complaint  Patient presents with  . Abdominal Pain    HPI Alexandria Chapman is a 72 y.o. female the past abdominal surgical history significant for abdominal hysterectomy (with oophorectomy bilaterally), appendectomy & cholecystectomy who presents emergency department today for lower abdominal pain that began on Friday.  Patient notes that she was recently started on prednisone taper by her orthopedist for right knee pain.  She reports that a few days after beginning this medication she started having severe lower abdominal pain that she rates as a 9/10 that is like a band across her lower abdomen but does not travel to the back or into her pelvis.  She states the pain is brought on with movement including leaning forward, lying back as well as ambulating.  She notes that if she is able to sit in a still position and not move she can make the pain go away.  She has tried Bentyl for her symptoms without any relief.  She notes no other medications or antipyretics tried prior to arrival.  She notes she has associated subjective fevers, chills, nausea without emesis, as well as loose stools.  She notes yesterday she had 8 loose stools that were non-melanous and nonbloody yesterday.  She reports normal bowel movement today.  She is still passing gas.  No recent antibiotic use.  No new foods or travel. Denies sick contacts.  She denies history of similar pain in the past.  Patient denies any upper abdominal pain or migration of pain.  She does note lower abdominal pain but denies any pelvic pain, vaginal discharge or vaginal bleeding and has had prior hysterectomy and oophorectomy.  Patient does report associated urinary urgency as well as frequency but denies any dysuria or hematuria.  No flank pain.  She denies abdominal trauma.  Patient has had prior colonoscopy  that showed diverticulosis.  HPI  Past Medical History:  Diagnosis Date  . Anxiety   . 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 TMJ disorder involving articular disc abnormality     There are no active problems to display for this patient.   Past Surgical History:  Procedure Laterality Date  . ABDOMINAL HYSTERECTOMY    . APPENDECTOMY    . CHOLECYSTECTOMY    . CYST EXCISION Right 10/06/2015   Procedure: RIGHT LONG FINGER EXCISION MUCOID CYST;  Surgeon: Leanora Cover, MD;  Location: [facility];  Service: Orthopedics;  Laterality: Right;  Bier block  . KNEE ARTHROSCOPY    . OOPHORECTOMY    . SHOULDER ARTHROSCOPY    . TONSILLECTOMY       OB History   None      Home Medications    Prior to Admission medications   Medication Sig Start Date End Date Taking? Authorizing Provider  ALOE VERA JUICE LIQD Take 2 oz by mouth.    [provider]  ALPRAZolam Duanne Moron) 0.25 MG tablet Take 1 tablet (0.25 mg total) by mouth at bedtime as needed for anxiety or sleep. 09/14/17 09/14/18  Aundra Dubin, MD  Biotin 5 MG CAPS Take by mouth.    [provider]  Calcium-Magnesium-Vitamin D (CALCIUM 500 PO) Take 500 mg by mouth.    [provider]  cholecalciferol (VITAMIN D) 1000 units tablet Take 1,000 Units by mouth daily.    [provider]  co-enzyme Q-10 50 MG capsule Take 50 mg by mouth daily.    [provider]  COD LIVER OIL PO Take 228 mg by mouth.    [provider]  cyanocobalamin 500 MCG tablet Take 500 mcg by mouth daily.    [provider]  dicyclomine (BENTYL) 20 MG tablet Take 20 mg by mouth 1 day or 1 dose.    [provider]  Magnesium 250 MG TABS Take 250 mg by mouth.    [provider]  OMEGA 3-6-9 FATTY ACIDS PO Take by mouth.    [provider]  pantoprazole (PROTONIX) 40 MG tablet Take 40 mg by mouth daily.    [provider]  polyethylene glycol  (MIRALAX / GLYCOLAX) packet Take 17 g by mouth daily.    [provider]  Probiotic Product (ALIGN PO) Take by mouth.    [provider]  Selenium 200 MCG CAPS Take by mouth.    [provider]    Family History Family History  Problem Relation Age of Onset  . Alcohol abuse Mother   . Bipolar disorder Mother   . Alcohol abuse Maternal Uncle   . Drug abuse Cousin   . OCD Other     Social History Social History   Tobacco Use  . Smoking status: Never Smoker  . Smokeless tobacco: Never Used  Substance Use Topics  . Alcohol use: Yes    Comment: occas  . Drug use: No     Allergies   Sulfa antibiotics; Align [acidophilus]; Erythromycin; Nsaids; Sucralfate; and Sudafed [pseudoephedrine hcl]   Review of Systems Review of Systems  All other systems reviewed and are negative.    Physical Exam Updated Vital Signs BP (!) 146/75 (BP Location: Right Arm)   Pulse 67   Temp 99.6 F (37.6 C) (Oral)   Resp 18   Ht 5\' 5"  (1.651 m)   Wt 59.9 kg (132 lb)   SpO2 100%   BMI 21.97 kg/m   Physical Exam  Constitutional: She appears well-developed and well-nourished.  HENT:  Head: Normocephalic and atraumatic.  Right Ear: External ear normal.  Left Ear: External ear normal.  Nose: Nose normal.  Mouth/Throat: Uvula is midline, oropharynx is clear and moist and mucous membranes are normal. No tonsillar exudate.  Eyes: Pupils are equal, round, and reactive to light. Right eye exhibits no discharge. Left eye exhibits no discharge. No scleral icterus.  Neck: Trachea normal. Neck supple. No spinous process tenderness present. No neck rigidity. Normal range of motion present.  Cardiovascular: Normal rate, regular rhythm and intact distal pulses.  No murmur heard. Pulses:      Radial pulses are 2+ on the right side, and 2+ on the left side.       Dorsalis pedis pulses are 2+ on the right side, and 2+ on the left side.       Posterior tibial pulses are 2+ on  the right side, and 2+ on the left side.  No lower extremity swelling or edema. Calves symmetric in size bilaterally.  Pulmonary/Chest: Effort normal and breath sounds normal. She exhibits no tenderness.  Abdominal: Soft. Bowel sounds are normal. There is tenderness in the right lower quadrant, periumbilical area, suprapubic area and left lower quadrant. There is rebound. There is no rigidity, no guarding and no CVA tenderness.  Prior abdominal incisional scars as well as laparoscopic scars without evidence of dehiscence or surrounding infection.  Musculoskeletal: She exhibits no edema.  Lymphadenopathy:  She has no cervical adenopathy.  Neurological: She is alert.  Skin: Skin is warm and dry. No rash noted. Rash is not vesicular. She is not diaphoretic.  Psychiatric: She has a normal mood and affect.  Nursing note and vitals reviewed.    ED Treatments / Results  Labs (all labs ordered are listed, but only abnormal results are displayed) Labs Reviewed  COMPREHENSIVE METABOLIC PANEL - Abnormal; Notable for the following components:      Result Value   Sodium 131 (*)    Potassium 3.4 (*)    Chloride 94 (*)    Glucose, Bld 105 (*)    Calcium 8.7 (*)    Total Bilirubin 1.7 (*)    All other components within normal limits  CBC - Abnormal; Notable for the following components:   WBC 17.1 (*)    All other components within normal limits  URINALYSIS, ROUTINE W REFLEX MICROSCOPIC - Abnormal; Notable for the following components:   Color, Urine STRAW (*)    Specific Gravity, Urine <1.005 (*)    Hgb urine dipstick TRACE (*)    All other components within normal limits  URINALYSIS, MICROSCOPIC (REFLEX) - Abnormal; Notable for the following components:   Bacteria, UA FEW (*)    All other components within normal limits  LIPASE, BLOOD  I-STAT CG4 LACTIC ACID, ED  I-STAT CG4 LACTIC ACID, ED    EKG None  Radiology Ct Abdomen Pelvis W Contrast  Result Date: 10/29/2017 CLINICAL DATA:   Pelvic pain. Nausea. Elevated white blood cell count. Previous cholecystectomy, hysterectomy, and appendectomy. EXAM: CT ABDOMEN AND PELVIS WITH CONTRAST TECHNIQUE: Multidetector CT imaging of the abdomen and pelvis was performed using the standard protocol following bolus administration of intravenous contrast. CONTRAST:  166mL ISOVUE-300 IOPAMIDOL (ISOVUE-300) INJECTION 61% COMPARISON:  None. FINDINGS: Lower chest: No acute abnormality. Hepatobiliary: There is a cyst in the liver measuring 2.7 cm. A tiny probable cyst is seen on series 2, image 27 measuring 3 or 4 mm. No other liver masses. Mild hepatic steatosis. Previous cholecystectomy. Prominence of the intra and extrahepatic biliary ducts are likely due to previous cholecystectomy. Portal vein is patent. Pancreas: Unremarkable. No pancreatic ductal dilatation or surrounding inflammatory changes. Spleen: Normal in size without focal abnormality. Adrenals/Urinary Tract: Adrenal glands are unremarkable. Kidneys are normal, without renal calculi, focal lesion, or hydronephrosis. Bladder is unremarkable. Stomach/Bowel: The stomach and small bowel are normal. Fecal loading is seen in the rectum and sigmoid colon. 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 There is fat stranding adjacent to the proximal sigmoid colon with some mild wall thickening. There is a pocket of extraluminal fluid and gas measuring up to 1.9 cm on series 2, image 52. There is also a focus of extraluminal gas on series 2, image 49. Finally, there is a focus of free intraperitoneal air best seen on sagittal image 37. Remainder of the colon is unremarkable. The patient is status post appendectomy. Vascular/Lymphatic: Atherosclerotic changes seen in the nonaneurysmal aorta. No adenopathy. Reproductive: Status post hysterectomy. No adnexal masses. Other: There is a focus of free intraperitoneal air best seen on sagittal image 37. Musculoskeletal: No acute or significant osseous findings. IMPRESSION:  1. The findings are most consistent with perforated diverticulitis. There is an adjacent pocket of fluid and air which is extraluminal and could represent an abscess or develop into an abscess. Two additional foci of extraluminal gas are seen consistent with microperforation, 1 near the pocket of air and fluid as well as a focus of intraperitoneal  air anteriorly. Recommend follow-up to resolution. 2. 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 Findings called to the patient's PA, Mr. Rodolph Bong Electronically Signed   By: Dorise Bullion III M.D   On: 10/29/2017 17:18    Procedures Procedures (including critical care time) CRITICAL CARE Performed by: Jillyn Ledger   Total critical care time: 45 minutes - perforated diverticulitis with possible intraabdominal abscess  Critical care time was exclusive of separately billable procedures and treating other patients.  Critical care was necessary to treat or prevent imminent or life-threatening deterioration.  Critical care was time spent personally by me on the following activities: development of treatment plan with patient and/or surrogate as well as nursing, discussions with consultants, evaluation of patient's response to treatment, examination of patient, obtaining history from patient or surrogate, ordering and performing treatments and interventions, ordering and review of laboratory studies, ordering and review of radiographic studies, pulse oximetry and re-evaluation of patient's condition.   Medications Ordered in ED Medications  sodium chloride 0.9 % bolus 1,000 mL (1,000 mLs Intravenous New Bag/Given 10/29/17 1636)  piperacillin-tazobactam (ZOSYN) IVPB 3.375 g (has no administration in time range)  ondansetron (ZOFRAN) injection 4 mg (4 mg Intravenous Given 10/29/17 1551)  morphine 4 MG/ML injection 4 mg (4 mg Intravenous Given 10/29/17 1637)  iopamidol (ISOVUE-300) 61 % injection 100 mL (100 mLs Intravenous Contrast Given 10/29/17 1656)      Initial Impression / Assessment and Plan / ED Course  I have reviewed the triage vital signs and the nursing notes.  Pertinent labs & imaging results that were available during my care of the patient were reviewed by me and considered in my medical decision making (see chart for details).      72 y.o. female with history of prior cholecystectomy, abdominal hysterectomy with oophorectomy, appendectomy is only daily medications are Xanax and as needed Bentyl presenting with abdominal pain since Friday.  Patient is not on blood thinners.  Her last p.o. intake was at 7:30 AM this morning. No associated fever. Notes some nausea, and loose stools.  CT scan shows perforated diverticulitis with pocket of fluid/air that could present a abscess.  Patient started on IV antibiotics per protocol (Zosyn).  Significant labs include a leukocytosis of 17.1.  Her vital signs remained stable.  No tachycardia or hypotension.  She has received 1.5 L of fluid in the department, 4 mg of Zofran and 8 mg of morphine.  Consult to general surgery, Dr. Kae Heller, recommends hospitalist admission. Will follow in the morning and likely recommend IR to see.   Dr. Jonelle Sidle rejected admission sitting that she requires a surgical admission and patient does not have any medical history that require hospitalist to admit.  [facility] understands and will re-page surgery.  I appreciate Dr. Kae Heller for admitting the patient. Patient to go to med-surg floor. [facility] aware. Patients pain improved. She was evaluated and appears safe for transfer.   Final Clinical Impressions(s) / ED Diagnoses   Final diagnoses:  Diverticulitis of large intestine with perforation and abscess, unspecified bleeding status    ED Discharge Orders    None       Lorelle Gibbs 10/29/17 Vena Rua, MD 10/29/17 [phone]

## 2017-10-29 NOTE — Progress Notes (Signed)
Patient arrive to the unit from [facility] at approximately 2050. She is alert and response and voiced no complaints at this time. Patient was medicated en route to unit with good effect. NPO maintained as ordered. Will continue to monitor for increased pain, fevers, nausea or vomiting.

## 2017-10-29 NOTE — ED Notes (Signed)
Patient transported to CT 

## 2017-10-29 NOTE — H&P (Signed)
Surgical H&P  CC: abdominal pain  HPI: This is an otherwise healthy 72-year-old woman who presented to [facility] this evening with lower abdominal pain which had begun 2 days ago.  She states that the pain was initially mellow, but proceeded to worsen over the weekend to the point that when she arrived at the ER her pain was a "20 out of 10 "it is in a band like distribution across the lower abdomen and was worsened by movement and certain positions.  The pain does not radiate to her back or into her pelvis or legs.  Pain was relieved by sitting in a still position, and relieved by Bentyl.  She has not tried any other medications.  She did note subjective fevers chills nausea without emesis as well as loose stools.  Denies any melena or hematochezia.  Had diarrhea yesterday with a normal bowel movement today.  Continues to pass flatus.  She reports associated urinary urgency and frequency but no dysuria or hematuria.  She has had colonoscopies in the past that did show diverticulosis.  She reports that she did have a flare similar to this about 2 years ago, and she thinks that there was some mention of "fluid" or some type of perforation at that time.  She states that she did have a CT scan.  At the time of arrival here at [facility], she states that she feels significantly better having received some antibiotics and IV pain medication already.  Her abdominal surgical history is notable for cholecystectomy, open appendectomy, hysterectomy and subsequent oophorectomy.  Allergies  Allergen Reactions  . Sulfa Antibiotics Anaphylaxis  . Erythromycin   . Nsaids Nausea And Vomiting  . Sucralfate Nausea Only  . Sudafed [Pseudoephedrine Hcl] Other (See Comments)    dizziness    Past Medical History:  Diagnosis Date  . Anxiety   . 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 TMJ disorder involving articular disc abnormality     Past Surgical History:  Procedure Laterality  Date  . ABDOMINAL HYSTERECTOMY    . APPENDECTOMY    . CHOLECYSTECTOMY    . CYST EXCISION Right 10/06/2015   Procedure: RIGHT LONG FINGER EXCISION MUCOID CYST;  Surgeon: Leanora Cover, MD;  Location: [facility];  Service: Orthopedics;  Laterality: Right;  Bier block  . KNEE ARTHROSCOPY    . OOPHORECTOMY    . SHOULDER ARTHROSCOPY    . TONSILLECTOMY      Family History  Problem Relation Age of Onset  . Alcohol abuse Mother   . Bipolar disorder Mother   . Alcohol abuse Maternal Uncle   . Drug abuse Cousin   . OCD Other     Social History   Socioeconomic History  . Marital status: Widowed    Spouse name: Not on file  . Number of children: Not on file  . Years of education: Not on file  . Highest education level: Not on file  Occupational History  . Not on file  Social Needs  . Financial resource strain: Not on file  . Food insecurity:    Worry: Not on file    Inability: Not on file  . Transportation needs:    Medical: Not on file    Non-medical: Not on file  Tobacco Use  . Smoking status: Never Smoker  . Smokeless tobacco: Never Used  Substance and Sexual Activity  . Alcohol use: Yes    Comment: occas  . Drug  use: No  . Sexual activity: Never  Lifestyle  . Physical activity:    Days per week: Not on file    Minutes per session: Not on file  . Stress: Not on file  Relationships  . Social connections:    Talks on phone: Not on file    Gets together: Not on file    Attends religious service: Not on file    Active member of club or organization: Not on file    Attends meetings of clubs or organizations: Not on file    Relationship status: Not on file  Other Topics Concern  . Not on file  Social History Narrative  . Not on file    No current facility-administered medications on file prior to encounter.    Current Outpatient Medications on File Prior to Encounter  Medication Sig Dispense Refill  . ALOE VERA JUICE LIQD Take 2 oz by mouth.    .  ALPRAZolam (XANAX) 0.25 MG tablet Take 1 tablet (0.25 mg total) by mouth at bedtime as needed for anxiety or sleep. 30 tablet 2  . Biotin 5 MG CAPS Take by mouth.    . Calcium-Magnesium-Vitamin D (CALCIUM 500 PO) Take 500 mg by mouth.    . cholecalciferol (VITAMIN D) 1000 units tablet Take 1,000 Units by mouth daily.    Marland Kitchen co-enzyme Q-10 50 MG capsule Take 50 mg by mouth daily.    . COD LIVER OIL PO Take 228 mg by mouth.    . cyanocobalamin 500 MCG tablet Take 500 mcg by mouth daily.    Marland Kitchen dicyclomine (BENTYL) 20 MG tablet Take 20 mg by mouth 1 day or 1 dose.    . Magnesium 250 MG TABS Take 250 mg by mouth.    . OMEGA 3-6-9 FATTY ACIDS PO Take by mouth.    . pantoprazole (PROTONIX) 40 MG tablet Take 40 mg by mouth daily.    . polyethylene glycol (MIRALAX / GLYCOLAX) packet Take 17 g by mouth daily.    . Probiotic Product (ALIGN PO) Take by mouth.    . Selenium 200 MCG CAPS Take by mouth.      Review of Systems: a complete, 10pt review of systems was completed with pertinent positives and negatives as documented in the HPI  Physical Exam: Vitals:   10/29/17 1900 10/29/17 1930  BP: (!) 147/73 (!) 146/71  Pulse: 70 70  Resp: 18 18  Temp:    SpO2: 96% 93%   Gen: A&Ox3, no distress Head: normocephalic, atraumatic Eyes: extraocular motions intact, anicteric.  Neck: supple without mass or thyromegaly Chest: unlabored respirations, symmetrical air entry, clear bilaterally   Cardiovascular: RRR with palpable distal pulses, no pedal edema Abdomen: soft, nondistended, tender across entire lower abdomen, left more than right.  There is voluntary guarding. No mass or organomegaly.  She has a well-healed oblique right lower quadrant incision as well as several laparoscopic sites. Extremities: warm, without edema, no deformities  Neuro: grossly intact Psych: appropriate mood and affect, normal insight  Skin: warm and dry   CBC Latest Ref Rng & Units 10/29/2017  WBC 4.0 - 10.5 K/uL 17.1(H)   Hemoglobin 12.0 - 15.0 g/dL 13.1  Hematocrit 36.0 - 46.0 % 38.9  Platelets 150 - 400 K/uL 228    CMP Latest Ref Rng & Units 10/29/2017  Glucose 70 - 99 mg/dL 105(H)  BUN 8 - 23 mg/dL 11  Creatinine 0.44 - 1.00 mg/dL 0.69  Sodium 135 - 145 mmol/L 131(L)  Potassium  3.5 - 5.1 mmol/L 3.4(L)  Chloride 98 - 111 mmol/L 94(L)  CO2 22 - 32 mmol/L 29  Calcium 8.9 - 10.3 mg/dL 8.7(L)  Total Protein 6.5 - 8.1 g/dL 7.7  Total Bilirubin 0.3 - 1.2 mg/dL 1.7(H)  Alkaline Phos 38 - 126 U/L 55  AST 15 - 41 U/L 24  ALT 0 - 44 U/L 15    No results found for: INR, PROTIME  Imaging: Ct Abdomen Pelvis W Contrast  Result Date: 10/29/2017 CLINICAL DATA:  Pelvic pain. Nausea. Elevated white blood cell count. Previous cholecystectomy, hysterectomy, and appendectomy. EXAM: CT ABDOMEN AND PELVIS WITH CONTRAST TECHNIQUE: Multidetector CT imaging of the abdomen and pelvis was performed using the standard protocol following bolus administration of intravenous contrast. CONTRAST:  152mL ISOVUE-300 IOPAMIDOL (ISOVUE-300) INJECTION 61% COMPARISON:  None. FINDINGS: Lower chest: No acute abnormality. Hepatobiliary: There is a cyst in the liver measuring 2.7 cm. A tiny probable cyst is seen on series 2, image 27 measuring 3 or 4 mm. No other liver masses. Mild hepatic steatosis. Previous cholecystectomy. Prominence of the intra and extrahepatic biliary ducts are likely due to previous cholecystectomy. Portal vein is patent. Pancreas: Unremarkable. No pancreatic ductal dilatation or surrounding inflammatory changes. Spleen: Normal in size without focal abnormality. Adrenals/Urinary Tract: Adrenal glands are unremarkable. Kidneys are normal, without renal calculi, focal lesion, or hydronephrosis. Bladder is unremarkable. Stomach/Bowel: The stomach and small bowel are normal. Fecal loading is seen in the rectum and sigmoid colon. 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 There is fat stranding adjacent to the proximal sigmoid colon  with some mild wall thickening. There is a pocket of extraluminal fluid and gas measuring up to 1.9 cm on series 2, image 52. There is also a focus of extraluminal gas on series 2, image 49. Finally, there is a focus of free intraperitoneal air best seen on sagittal image 37. Remainder of the colon is unremarkable. The patient is status post appendectomy. Vascular/Lymphatic: Atherosclerotic changes seen in the nonaneurysmal aorta. No adenopathy. Reproductive: Status post hysterectomy. No adnexal masses. Other: There is a focus of free intraperitoneal air best seen on sagittal image 37. Musculoskeletal: No acute or significant osseous findings. IMPRESSION: 1. The findings are most consistent with perforated diverticulitis. There is an adjacent pocket of fluid and air which is extraluminal and could represent an abscess or develop into an abscess. Two additional foci of extraluminal gas are seen consistent with microperforation, 1 near the pocket of air and fluid as well as a focus of intraperitoneal air anteriorly. Recommend follow-up to resolution. 2. 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 Findings called to the patient's PA, Mr. Rodolph Bong Electronically Signed   By: Dorise Bullion III M.D   On: 10/29/2017 17:18    A/P: Diverticulitis with microperforation.  Admit for bowel rest, IV antibiotics, fluid resuscitation, serial labs and exams.  I suspect that this small abscess will not be accessible for percutaneous drainage.   Romana Juniper, MD [facility] Surgery, [place] Pager [phone]

## 2017-10-30 LAB — BASIC METABOLIC PANEL
Anion gap: 9 (ref 5–15)
BUN: 10 mg/dL (ref 8–23)
CALCIUM: 8.2 mg/dL — AB (ref 8.9–10.3)
CO2: 25 mmol/L (ref 22–32)
CREATININE: 0.64 mg/dL (ref 0.44–1.00)
Chloride: 100 mmol/L (ref 98–111)
Glucose, Bld: 84 mg/dL (ref 70–99)
Potassium: 3.6 mmol/L (ref 3.5–5.1)
SODIUM: 134 mmol/L — AB (ref 135–145)

## 2017-10-30 LAB — CBC
HCT: 37.4 % (ref 36.0–46.0)
Hemoglobin: 12.4 g/dL (ref 12.0–15.0)
MCH: 29.3 pg (ref 26.0–34.0)
MCHC: 33.2 g/dL (ref 30.0–36.0)
MCV: 88.4 fL (ref 78.0–100.0)
PLATELETS: 205 10*3/uL (ref 150–400)
RBC: 4.23 MIL/uL (ref 3.87–5.11)
RDW: 13.3 % (ref 11.5–15.5)
WBC: 15.7 10*3/uL — ABNORMAL HIGH (ref 4.0–10.5)

## 2017-10-30 LAB — HIV ANTIBODY (ROUTINE TESTING W REFLEX): HIV SCREEN 4TH GENERATION: NONREACTIVE

## 2017-10-30 MED ORDER — DICYCLOMINE HCL 20 MG PO TABS
20.0000 mg | ORAL_TABLET | Freq: Two times a day (BID) | ORAL | Status: DC | PRN
  Administered 2017-10-30 – 2017-11-03 (×5): 20 mg via ORAL
  Filled 2017-10-30 (×9): qty 1

## 2017-10-30 MED ORDER — FAMOTIDINE IN NACL 20-0.9 MG/50ML-% IV SOLN
20.0000 mg | INTRAVENOUS | Status: DC
  Administered 2017-10-31 – 2017-11-02 (×3): 20 mg via INTRAVENOUS
  Filled 2017-10-30 (×4): qty 50

## 2017-10-30 NOTE — Progress Notes (Signed)
[facility] Progress Note     Subjective: CC: lower abdominal pain Patient still having lower abdominal pain but reports it is much improved from when she came in. Denies nausea, passing small amount flatus. Has tolerated sips with medications.   Objective: Vital signs in last 24 hours: Temp:  [98.1 F (36.7 C)-99.9 F (37.7 C)] 98.1 F (36.7 C) (07/08 0607) Pulse Rate:  [64-71] 66 (07/08 0607) Resp:  [14-18] 16 (07/08 0607) BP: (134-167)/(63-81) 134/63 (07/08 0607) SpO2:  [93 %-100 %] 97 % (07/08 0607) Weight:  [59.9 kg (132 lb)] 59.9 kg (132 lb) (07/07 1512) Last BM Date: 10/28/17  Intake/Output from previous day: 07/07 0701 - 07/08 0700 In: 2625.4 [I.V.:1022.8; IV Piggyback:1602.6] Out: -  Intake/Output this shift: No intake/output data recorded.  PE: Gen:  Alert, NAD, pleasant Card:  Regular rate and rhythm Pulm:  Normal effort, clear to auscultation bilaterally Abd: Soft, TTP in lower abdomen, no guarding, mild rebound tenderness, non-distended, bowel sounds present, no HSM Skin: warm and dry, no rashes  Psych: A&Ox3   Lab Results:  Recent Labs    10/29/17 1542 10/30/17 0517  WBC 17.1* 15.7*  HGB 13.1 12.4  HCT 38.9 37.4  PLT 228 205   BMET Recent Labs    10/29/17 1542 10/30/17 0517  NA 131* 134*  K 3.4* 3.6  CL 94* 100  CO2 29 25  GLUCOSE 105* 84  BUN 11 10  CREATININE 0.69 0.64  CALCIUM 8.7* 8.2*   PT/INR No results for input(s): LABPROT, INR in the last 72 hours. CMP     Component Value Date/Time   NA 134 (L) 10/30/2017 0517   K 3.6 10/30/2017 0517   CL 100 10/30/2017 0517   CO2 25 10/30/2017 0517   GLUCOSE 84 10/30/2017 0517   BUN 10 10/30/2017 0517   CREATININE 0.64 10/30/2017 0517   CALCIUM 8.2 (L) 10/30/2017 0517   PROT 7.7 10/29/2017 1542   ALBUMIN 3.9 10/29/2017 1542   AST 24 10/29/2017 1542   ALT 15 10/29/2017 1542   ALKPHOS 55 10/29/2017 1542   BILITOT 1.7 (H) 10/29/2017 1542   GFRNONAA >60 10/30/2017 0517    GFRAA >60 10/30/2017 0517   Lipase     Component Value Date/Time   LIPASE 26 10/29/2017 1542       Studies/Results: Ct Abdomen Pelvis W Contrast  Result Date: 10/29/2017 CLINICAL DATA:  Pelvic pain. Nausea. Elevated white blood cell count. Previous cholecystectomy, hysterectomy, and appendectomy. EXAM: CT ABDOMEN AND PELVIS WITH CONTRAST TECHNIQUE: Multidetector CT imaging of the abdomen and pelvis was performed using the standard protocol following bolus administration of intravenous contrast. CONTRAST:  138mL ISOVUE-300 IOPAMIDOL (ISOVUE-300) INJECTION 61% COMPARISON:  None. FINDINGS: Lower chest: No acute abnormality. Hepatobiliary: There is a cyst in the liver measuring 2.7 cm. A tiny probable cyst is seen on series 2, image 27 measuring 3 or 4 mm. No other liver masses. Mild hepatic steatosis. Previous cholecystectomy. Prominence of the intra and extrahepatic biliary ducts are likely due to previous cholecystectomy. Portal vein is patent. Pancreas: Unremarkable. No pancreatic ductal dilatation or surrounding inflammatory changes. Spleen: Normal in size without focal abnormality. Adrenals/Urinary Tract: Adrenal glands are unremarkable. Kidneys are normal, without renal calculi, focal lesion, or hydronephrosis. Bladder is unremarkable. Stomach/Bowel: The stomach and small bowel are normal. Fecal loading is seen in the rectum and sigmoid colon. 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 There is fat stranding adjacent to the proximal sigmoid colon with some mild wall thickening. There is  a pocket of extraluminal fluid and gas measuring up to 1.9 cm on series 2, image 52. There is also a focus of extraluminal gas on series 2, image 49. Finally, there is a focus of free intraperitoneal air best seen on sagittal image 37. Remainder of the colon is unremarkable. The patient is status post appendectomy. Vascular/Lymphatic: Atherosclerotic changes seen in the nonaneurysmal aorta. No adenopathy. Reproductive:  Status post hysterectomy. No adnexal masses. Other: There is a focus of free intraperitoneal air best seen on sagittal image 37. Musculoskeletal: No acute or significant osseous findings. IMPRESSION: 1. The findings are most consistent with perforated diverticulitis. There is an adjacent pocket of fluid and air which is extraluminal and could represent an abscess or develop into an abscess. Two additional foci of extraluminal gas are seen consistent with microperforation, 1 near the pocket of air and fluid as well as a focus of intraperitoneal air anteriorly. Recommend follow-up to resolution. 2. 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 Findings called to the patient's PA, Mr. Rodolph Bong Electronically Signed   By: Dorise Bullion III M.D   On: 10/29/2017 17:18    Anti-infectives: Anti-infectives (From admission, onward)   Start     Dose/Rate Route Frequency Ordered Stop   10/29/17 2300  piperacillin-tazobactam (ZOSYN) IVPB 3.375 g     3.375 g 12.5 mL/hr over 240 Minutes Intravenous Every 8 hours 10/29/17 2108     10/29/17 1730  piperacillin-tazobactam (ZOSYN) IVPB 3.375 g     3.375 g 100 mL/hr over 30 Minutes Intravenous  Once 10/29/17 1723 10/29/17 1843       Assessment/Plan Diverticulitis with microperforation  - CT 7/7: fat stranding adjacent to the proximal sigmoid colon with some mild wall thickening. There is a pocket of extraluminal fluid and gas measuring up to 1.9 cm  - WBC 15.7 from 17.1, afebrile - continue IV abx - pt reports pain still present but improving - may start some ice chips today - repeat CT in a few days pending clinical improvement  FEN: NPO - ice chips, IVF VTE: SCDs, lovenox ID: IV zosyn 7/7>>  LOS: 1 day    Brigid Re , [facility] Surgery 10/30/2017, 7:23 AM Pager: [phone] Consults: [phone] Mon-Fri 7:00 am-4:30 pm Sat-Sun 7:00 am-11:30 am

## 2017-10-30 NOTE — Progress Notes (Signed)
Late entry:  Pt stated she cannot take Protonix as it bloats her, PA was notified and new orders were given.

## 2017-10-31 LAB — BASIC METABOLIC PANEL
ANION GAP: 11 (ref 5–15)
BUN: 12 mg/dL (ref 8–23)
CALCIUM: 8.2 mg/dL — AB (ref 8.9–10.3)
CHLORIDE: 97 mmol/L — AB (ref 98–111)
CO2: 24 mmol/L (ref 22–32)
Creatinine, Ser: 0.77 mg/dL (ref 0.44–1.00)
GFR calc Af Amer: >60 mL/min (ref >60)
GFR calc non Af Amer: >60 mL/min (ref >60)
GLUCOSE: 61 mg/dL — AB (ref 70–99)
POTASSIUM: 3.3 mmol/L — AB (ref 3.5–5.1)
Sodium: 132 mmol/L — ABNORMAL LOW (ref 135–145)

## 2017-10-31 LAB — CBC
HEMATOCRIT: 39.4 % (ref 36.0–46.0)
HEMOGLOBIN: 12.9 g/dL (ref 12.0–15.0)
MCH: 29.1 pg (ref 26.0–34.0)
MCHC: 32.7 g/dL (ref 30.0–36.0)
MCV: 88.7 fL (ref 78.0–100.0)
Platelets: 243 10*3/uL (ref 150–400)
RBC: 4.44 MIL/uL (ref 3.87–5.11)
RDW: 13 % (ref 11.5–15.5)
WBC: 12.8 10*3/uL — AB (ref 4.0–10.5)

## 2017-10-31 MED ORDER — POTASSIUM CHLORIDE CRYS ER 20 MEQ PO TBCR
30.0000 meq | EXTENDED_RELEASE_TABLET | Freq: Three times a day (TID) | ORAL | Status: AC
  Administered 2017-10-31 (×3): 30 meq via ORAL
  Filled 2017-10-31 (×3): qty 1

## 2017-10-31 NOTE — Progress Notes (Signed)
Pt complaining of bloating again, believes it is the Pepcid.

## 2017-10-31 NOTE — Progress Notes (Signed)
Pain well-controlled overnight with PO pain medication. Patient up to bathroom voiding. Vitals stable.

## 2017-10-31 NOTE — Progress Notes (Signed)
[facility] Progress Note     Subjective: CC: abdominal pain Pain improving but still present, dull ache in lower abdomen. Patient with some nausea overnight, relieved with medication. No vomiting. Passing flatus. Still feels like she might be a little feverish. Tmax 99.2.   Objective: Vital signs in last 24 hours: Temp:  [98 F (36.7 C)-99.2 F (37.3 C)] 99.2 F (37.3 C) (07/09 0518) Pulse Rate:  [59-64] 64 (07/09 0518) Resp:  [16] 16 (07/08 2111) BP: (119-167)/(54-69) 119/54 (07/09 0518) SpO2:  [97 %-98 %] 97 % (07/09 0518) Last BM Date: 10/28/17  Intake/Output from previous day: 07/08 0701 - 07/09 0700 In: 4166.7 [I.V.:4116.7; IV Piggyback:50] Out: 3000 [Urine:3000] Intake/Output this shift: No intake/output data recorded.  PE: Gen:  Alert, NAD, pleasant Card:  Regular rate and rhythm Pulm:  Normal effort, clear to auscultation bilaterally Abd: Soft, TTP in lower abdomen, no guarding, mild rebound tenderness, non-distended, bowel sounds present, no HSM Skin: warm and dry, no rashes  Psych: A&Ox3    Lab Results:  Recent Labs    10/30/17 0517 10/31/17 0534  WBC 15.7* 12.8*  HGB 12.4 12.9  HCT 37.4 39.4  PLT 205 243   BMET Recent Labs    10/30/17 0517 10/31/17 0534  NA 134* 132*  K 3.6 3.3*  CL 100 97*  CO2 25 24  GLUCOSE 84 61*  BUN 10 12  CREATININE 0.64 0.77  CALCIUM 8.2* 8.2*   PT/INR No results for input(s): LABPROT, INR in the last 72 hours. CMP     Component Value Date/Time   NA 132 (L) 10/31/2017 0534   K 3.3 (L) 10/31/2017 0534   CL 97 (L) 10/31/2017 0534   CO2 24 10/31/2017 0534   GLUCOSE 61 (L) 10/31/2017 0534   BUN 12 10/31/2017 0534   CREATININE 0.77 10/31/2017 0534   CALCIUM 8.2 (L) 10/31/2017 0534   PROT 7.7 10/29/2017 1542   ALBUMIN 3.9 10/29/2017 1542   AST 24 10/29/2017 1542   ALT 15 10/29/2017 1542   ALKPHOS 55 10/29/2017 1542   BILITOT 1.7 (H) 10/29/2017 1542   GFRNONAA >60 10/31/2017 0534   GFRAA >60  10/31/2017 0534   Lipase     Component Value Date/Time   LIPASE 26 10/29/2017 1542       Studies/Results: Ct Abdomen Pelvis W Contrast  Result Date: 10/29/2017 CLINICAL DATA:  Pelvic pain. Nausea. Elevated white blood cell count. Previous cholecystectomy, hysterectomy, and appendectomy. EXAM: CT ABDOMEN AND PELVIS WITH CONTRAST TECHNIQUE: Multidetector CT imaging of the abdomen and pelvis was performed using the standard protocol following bolus administration of intravenous contrast. CONTRAST:  146mL ISOVUE-300 IOPAMIDOL (ISOVUE-300) INJECTION 61% COMPARISON:  None. FINDINGS: Lower chest: No acute abnormality. Hepatobiliary: There is a cyst in the liver measuring 2.7 cm. A tiny probable cyst is seen on series 2, image 27 measuring 3 or 4 mm. No other liver masses. Mild hepatic steatosis. Previous cholecystectomy. Prominence of the intra and extrahepatic biliary ducts are likely due to previous cholecystectomy. Portal vein is patent. Pancreas: Unremarkable. No pancreatic ductal dilatation or surrounding inflammatory changes. Spleen: Normal in size without focal abnormality. Adrenals/Urinary Tract: Adrenal glands are unremarkable. Kidneys are normal, without renal calculi, focal lesion, or hydronephrosis. Bladder is unremarkable. Stomach/Bowel: The stomach and small bowel are normal. Fecal loading is seen in the rectum and sigmoid colon. 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 There is fat stranding adjacent to the proximal sigmoid colon with some mild wall thickening. There is a pocket of extraluminal  fluid and gas measuring up to 1.9 cm on series 2, image 52. There is also a focus of extraluminal gas on series 2, image 49. Finally, there is a focus of free intraperitoneal air best seen on sagittal image 37. Remainder of the colon is unremarkable. The patient is status post appendectomy. Vascular/Lymphatic: Atherosclerotic changes seen in the nonaneurysmal aorta. No adenopathy. Reproductive: Status post  hysterectomy. No adnexal masses. Other: There is a focus of free intraperitoneal air best seen on sagittal image 37. Musculoskeletal: No acute or significant osseous findings. IMPRESSION: 1. The findings are most consistent with perforated diverticulitis. There is an adjacent pocket of fluid and air which is extraluminal and could represent an abscess or develop into an abscess. Two additional foci of extraluminal gas are seen consistent with microperforation, 1 near the pocket of air and fluid as well as a focus of intraperitoneal air anteriorly. Recommend follow-up to resolution. 2. 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 Findings called to the patient's PA, Mr. Rodolph Bong Electronically Signed   By: Dorise Bullion III M.D   On: 10/29/2017 17:18    Anti-infectives: Anti-infectives (From admission, onward)   Start     Dose/Rate Route Frequency Ordered Stop   10/29/17 2300  piperacillin-tazobactam (ZOSYN) IVPB 3.375 g     3.375 g 12.5 mL/hr over 240 Minutes Intravenous Every 8 hours 10/29/17 2108     10/29/17 1730  piperacillin-tazobactam (ZOSYN) IVPB 3.375 g     3.375 g 100 mL/hr over 30 Minutes Intravenous  Once 10/29/17 1723 10/29/17 1843       Assessment/Plan Diverticulitis with microperforation  - CT 7/7: fat stranding adjacent to the proximal sigmoid colon with some mild wall thickening. There is a pocket of extraluminal fluid and gas measuring up to 1.9 cm  - WBC 12.8, afebrile - continue IV abx - pt reports pain still present but improving - allow sips of clears today - repeat CT in a few days pending clinical improvement  FEN: sips/chips, IVF VTE: SCDs, lovenox ID: IV zosyn 7/7>>    LOS: 2 days    Brigid Re , [facility] 10/31/2017, 9:11 AM Pager: [phone] Consults: [phone] Mon-Fri 7:00 am-4:30 pm Sat-Sun 7:00 am-11:30 am

## 2017-10-31 NOTE — Progress Notes (Signed)
Pt had a normal bowel movement today, states she feels much better and less bloated.

## 2017-11-01 ENCOUNTER — Inpatient Hospital Stay (HOSPITAL_COMMUNITY): Payer: MEDICARE

## 2017-11-01 ENCOUNTER — Encounter (HOSPITAL_COMMUNITY): Payer: Self-pay | Admitting: Radiology

## 2017-11-01 DIAGNOSIS — K5720 Diverticulitis of large intestine with perforation and abscess without bleeding: Secondary | ICD-10-CM | POA: Insufficient documentation

## 2017-11-01 LAB — CBC
HCT: 38.2 % (ref 36.0–46.0)
Hemoglobin: 12.6 g/dL (ref 12.0–15.0)
MCH: 28.6 pg (ref 26.0–34.0)
MCHC: 33 g/dL (ref 30.0–36.0)
MCV: 86.8 fL (ref 78.0–100.0)
PLATELETS: 262 10*3/uL (ref 150–400)
RBC: 4.4 MIL/uL (ref 3.87–5.11)
RDW: 12.9 % (ref 11.5–15.5)
WBC: 9.4 10*3/uL (ref 4.0–10.5)

## 2017-11-01 LAB — BASIC METABOLIC PANEL
Anion gap: 10 (ref 5–15)
BUN: 7 mg/dL — AB (ref 8–23)
CHLORIDE: 98 mmol/L (ref 98–111)
CO2: 26 mmol/L (ref 22–32)
CREATININE: 0.68 mg/dL (ref 0.44–1.00)
Calcium: 8.5 mg/dL — ABNORMAL LOW (ref 8.9–10.3)
GFR calc Af Amer: >60 mL/min (ref >60)
Glucose, Bld: 77 mg/dL (ref 70–99)
Potassium: 3.9 mmol/L (ref 3.5–5.1)
SODIUM: 134 mmol/L — AB (ref 135–145)

## 2017-11-01 MED ORDER — IOPAMIDOL (ISOVUE-300) INJECTION 61%
INTRAVENOUS | Status: AC
  Filled 2017-11-01: qty 100

## 2017-11-01 MED ORDER — IOPAMIDOL (ISOVUE-300) INJECTION 61%
100.0000 mL | Freq: Once | INTRAVENOUS | Status: AC | PRN
  Administered 2017-11-01: 100 mL via INTRAVENOUS

## 2017-11-01 MED ORDER — IOPAMIDOL (ISOVUE-300) INJECTION 61%: 15.0000 mL | Freq: Once | INTRAVENOUS | Status: AC | PRN

## 2017-11-01 MED ORDER — IOPAMIDOL (ISOVUE-300) INJECTION 61%
INTRAVENOUS | Status: AC
  Filled 2017-11-01: qty 30

## 2017-11-01 MED FILL — Fentanyl Citrate Preservative Free (PF) Inj 100 MCG/2ML: INTRAMUSCULAR | Qty: 2 | Status: AC

## 2017-11-01 NOTE — Progress Notes (Signed)
Patient ID: Alexandria Chapman, female   DOB: [birth year], 72 y.o.   MRN: [id number] [facility] Surgery Progress Note:   * No surgery found *  Subjective: Mental status is clear.  Had small BM during the night.  Objective: Vital signs in last 24 hours: Temp:  [98.6 F (37 C)-99.4 F (37.4 C)] 98.6 F (37 C) (07/10 0519) Pulse Rate:  [57-66] 57 (07/10 0519) Resp:  [16] 16 (07/10 0519) BP: (141-166)/(65-83) 141/73 (07/10 0519) SpO2:  [98 %-99 %] 99 % (07/10 0519)  Intake/Output from previous day: 07/09 0701 - 07/10 0700 In: 3373.1 [P.O.:120; I.V.:3035.4; IV Piggyback:217.7] Out: 4101 [Urine:4101] Intake/Output this shift: No intake/output data recorded.  Physical Exam: Work of breathing is normal.  Still having some lower abdominal pain  Lab Results:  Results for orders placed or performed during the hospital encounter of 10/29/17 (from the past 48 hour(s))  CBC     Status: Abnormal   Collection Time: 10/31/17  5:34 AM  Result Value Ref Range   WBC 12.8 (H) 4.0 - 10.5 K/uL   RBC 4.44 3.87 - 5.11 MIL/uL   Hemoglobin 12.9 12.0 - 15.0 g/dL   HCT 39.4 36.0 - 46.0 %   MCV 88.7 78.0 - 100.0 fL   MCH 29.1 26.0 - 34.0 pg   MCHC 32.7 30.0 - 36.0 g/dL   RDW 13.0 11.5 - 15.5 %   Platelets 243 150 - 400 K/uL    Comment: Performed at [facility], [place] [address] [place], [place] [address]  Basic metabolic panel     Status: Abnormal   Collection Time: 10/31/17  5:34 AM  Result Value Ref Range   Sodium 132 (L) 135 - 145 mmol/L   Potassium 3.3 (L) 3.5 - 5.1 mmol/L   Chloride 97 (L) 98 - 111 mmol/L    Comment: Please note change in reference range.   CO2 24 22 - 32 mmol/L   Glucose, Bld 61 (L) 70 - 99 mg/dL    Comment: Please note change in reference range.   BUN 12 8 - 23 mg/dL    Comment: Please note change in reference range.   Creatinine, Ser 0.77 0.44 - 1.00 mg/dL   Calcium 8.2 (L) 8.9 - 10.3 mg/dL   GFR calc non Af Amer >60 >60 mL/min   GFR calc Af Amer >60  >60 mL/min    Comment: (NOTE) The eGFR has been calculated using the CKD EPI equation. This calculation has not been validated in all clinical situations. eGFR's persistently <60 mL/min signify possible Chronic Kidney Disease.    Anion gap 11 5 - 15    Comment: Performed at [facility], [place] [address], [place], [place] [address]  CBC     Status: None   Collection Time: 11/01/17  5:41 AM  Result Value Ref Range   WBC 9.4 4.0 - 10.5 K/uL   RBC 4.40 3.87 - 5.11 MIL/uL   Hemoglobin 12.6 12.0 - 15.0 g/dL   HCT 38.2 36.0 - 46.0 %   MCV 86.8 78.0 - 100.0 fL   MCH 28.6 26.0 - 34.0 pg   MCHC 33.0 30.0 - 36.0 g/dL   RDW 12.9 11.5 - 15.5 %   Platelets 262 150 - 400 K/uL    Comment: Performed at [facility], [place] [address] [place], [place] [address]  Basic metabolic panel     Status: Abnormal   Collection Time: 11/01/17  5:41 AM  Result Value Ref Range  Sodium 134 (L) 135 - 145 mmol/L   Potassium 3.9 3.5 - 5.1 mmol/L   Chloride 98 98 - 111 mmol/L    Comment: Please note change in reference range.   CO2 26 22 - 32 mmol/L   Glucose, Bld 77 70 - 99 mg/dL    Comment: Please note change in reference range.   BUN 7 (L) 8 - 23 mg/dL    Comment: Please note change in reference range.   Creatinine, Ser 0.68 0.44 - 1.00 mg/dL   Calcium 8.5 (L) 8.9 - 10.3 mg/dL   GFR calc non Af Amer >60 >60 mL/min   GFR calc Af Amer >60 >60 mL/min    Comment: (NOTE) The eGFR has been calculated using the CKD EPI equation. This calculation has not been validated in all clinical situations. eGFR's persistently <60 mL/min signify possible Chronic Kidney Disease.    Anion gap 10 5 - 15    Comment: Performed at [facility], [place] [address] [place], [place] [address]    Radiology/Results: No results found.  Anti-infectives: Anti-infectives (From admission, onward)   Start     Dose/Rate Route Frequency Ordered Stop   10/29/17 2300   piperacillin-tazobactam (ZOSYN) IVPB 3.375 g     3.375 g 12.5 mL/hr over 240 Minutes Intravenous Every 8 hours 10/29/17 2108     10/29/17 1730  piperacillin-tazobactam (ZOSYN) IVPB 3.375 g     3.375 g 100 mL/hr over 30 Minutes Intravenous  Once 10/29/17 1723 10/29/17 1843      Assessment/Plan: Problem List: Patient Active Problem List   Diagnosis Date Noted  . Diverticulitis of colon 11/01/2017  . Diverticulitis 10/29/2017    Will repeat CT of abdomen pelvis today * No surgery found *    LOS: 3 days   Matt B. Hassell Done, MD, [facility] [phone] beeper [phone]  11/01/2017 8:13 AM

## 2017-11-01 NOTE — Care Management Important Message (Signed)
Important Message  Patient Details  Name: Michal Strzelecki MRN: [id number] Date of Birth: [birth year]   Medicare Important Message Given:  Yes    Kerin Salen 11/01/2017, 2:03 [place] Message  Patient Details  Name: Betsabe Iglesia MRN: [id number] Date of Birth: Nov 02, 1945   Medicare Important Message Given:  Yes    Kerin Salen 11/01/2017, 2:02 PM

## 2017-11-02 LAB — CBC
HCT: 38.1 % (ref 36.0–46.0)
HEMOGLOBIN: 12.8 g/dL (ref 12.0–15.0)
MCH: 29 pg (ref 26.0–34.0)
MCHC: 33.6 g/dL (ref 30.0–36.0)
MCV: 86.2 fL (ref 78.0–100.0)
PLATELETS: 258 10*3/uL (ref 150–400)
RBC: 4.42 MIL/uL (ref 3.87–5.11)
RDW: 12.8 % (ref 11.5–15.5)
WBC: 9.6 10*3/uL (ref 4.0–10.5)

## 2017-11-02 LAB — BASIC METABOLIC PANEL
Anion gap: 11 (ref 5–15)
BUN: <5 mg/dL — ABNORMAL LOW (ref 8–23)
CHLORIDE: 98 mmol/L (ref 98–111)
CO2: 26 mmol/L (ref 22–32)
Calcium: 8.6 mg/dL — ABNORMAL LOW (ref 8.9–10.3)
Creatinine, Ser: 0.62 mg/dL (ref 0.44–1.00)
Glucose, Bld: 122 mg/dL — ABNORMAL HIGH (ref 70–99)
POTASSIUM: 3.8 mmol/L (ref 3.5–5.1)
SODIUM: 135 mmol/L (ref 135–145)

## 2017-11-02 MED ORDER — FAMOTIDINE 20 MG PO TABS: 20.0000 mg | ORAL_TABLET | Freq: Every day | ORAL | Status: DC

## 2017-11-02 NOTE — Progress Notes (Signed)
[facility] Progress Note     Subjective: CC: nausea Patient reports nausea after drinking some apple juice, reports she tolerated other clear liquids well. Abdominal pain continues to improve. Patient having BMs, initially more formed now characterized as mushy.   Objective: Vital signs in last 24 hours: Temp:  [98.4 F (36.9 C)-99.6 F (37.6 C)] 99.6 F (37.6 C) (07/11 0649) Pulse Rate:  [61-68] 68 (07/11 0649) Resp:  [16-17] 17 (07/11 0649) BP: (140-153)/(65-79) 146/73 (07/11 0649) SpO2:  [98 %-100 %] 98 % (07/11 0649) Last BM Date: 11/01/17  Intake/Output from previous day: 07/10 0701 - 07/11 0700 In: 2766.7 [P.O.:250; I.V.:2214.6; IV Piggyback:302.1] Out: 950 [Urine:950] Intake/Output this shift: No intake/output data recorded.  PE: Gen:  Alert, NAD, pleasant Card:  Regular rate and rhythm, pedal pulses 2+ BL Pulm:  Normal effort, clear to auscultation bilaterally Abd: Soft, tender with deep palpation of LLQ, non-distended, bowel sounds present, no HSM Skin: warm and dry, no rashes  Psych: A&Ox3   Lab Results:  Recent Labs    10/31/17 0534 11/01/17 0541  WBC 12.8* 9.4  HGB 12.9 12.6  HCT 39.4 38.2  PLT 243 262   BMET Recent Labs    10/31/17 0534 11/01/17 0541  NA 132* 134*  K 3.3* 3.9  CL 97* 98  CO2 24 26  GLUCOSE 61* 77  BUN 12 7*  CREATININE 0.77 0.68  CALCIUM 8.2* 8.5*   PT/INR No results for input(s): LABPROT, INR in the last 72 hours. CMP     Component Value Date/Time   NA 134 (L) 11/01/2017 0541   K 3.9 11/01/2017 0541   CL 98 11/01/2017 0541   CO2 26 11/01/2017 0541   GLUCOSE 77 11/01/2017 0541   BUN 7 (L) 11/01/2017 0541   CREATININE 0.68 11/01/2017 0541   CALCIUM 8.5 (L) 11/01/2017 0541   PROT 7.7 10/29/2017 1542   ALBUMIN 3.9 10/29/2017 1542   AST 24 10/29/2017 1542   ALT 15 10/29/2017 1542   ALKPHOS 55 10/29/2017 1542   BILITOT 1.7 (H) 10/29/2017 1542   GFRNONAA >60 11/01/2017 0541   GFRAA >60 11/01/2017 0541    Lipase     Component Value Date/Time   LIPASE 26 10/29/2017 1542       Studies/Results: Ct Abdomen Pelvis W Contrast  Result Date: 11/01/2017 CLINICAL DATA:  Low abdominal and pelvic pressure. Nausea. Diverticulitis with pelvic abscess. EXAM: CT ABDOMEN AND PELVIS WITH CONTRAST TECHNIQUE: Multidetector CT imaging of the abdomen and pelvis was performed using the standard protocol following bolus administration of intravenous contrast. CONTRAST:  117mL ISOVUE-300 IOPAMIDOL (ISOVUE-300) INJECTION 61% COMPARISON:  10/29/2017 FINDINGS: Lower chest: Chronic atelectasis or scarring noted at the lung bases. Hepatobiliary: Stable hepatic cyst in the dome of liver. Tiny hypoattenuating lesion inferior right liver is also stable. Gallbladder surgically absent. Extrahepatic biliary duct prominence with 8 mm diameter common bile duct is unchanged in the interval. Pancreas: No focal mass lesion. No dilatation of the main duct. No intraparenchymal cyst. No peripancreatic edema. Spleen: No splenomegaly. No focal mass lesion. Adrenals/Urinary Tract: No adrenal nodule or mass. Kidneys unremarkable. No evidence for hydroureter. The urinary bladder appears normal for the degree of distention. Stomach/Bowel: Stomach is nondistended. No gastric wall thickening. No evidence of outlet obstruction. Duodenum is normally positioned as is the ligament of Treitz. No small bowel wall thickening. No small bowel dilatation. The terminal ileum is normal. The appendix is not visualized, but there is no edema or inflammation in the region  of the cecum. Colon is nondilated. Diverticular changes are noted in the left colon. Wall thickening with pericolonic edema/inflammation again noted in the proximal sigmoid segment. The extraluminal collection of gas and fluid in the left lower quadrant persists and measures 2.6 cm today compared to 1.9 cm previously. This collection is contiguous with a loop of small bowel the demonstrates wall  thickening, presumably secondary. Vascular/Lymphatic: There is abdominal aortic atherosclerosis without aneurysm. There is no gastrohepatic or hepatoduodenal ligament lymphadenopathy. No intraperitoneal or retroperitoneal lymphadenopathy. No pelvic sidewall lymphadenopathy. Reproductive: Uterus is surgically absent. There is no adnexal mass. Other: Trace free fluid is seen in the cul-de-sac with edema/inflammation in the sigmoid mesocolon and lower pelvic mesentery. Musculoskeletal: Degenerative changes noted in the symphysis pubis. Bones are diffusely demineralized. No worrisome lytic or sclerotic osseous abnormality. IMPRESSION: 1. Similar appearance of edema/inflammation in the pelvis with a left-sided predominance. Extraluminal collection of gas and fluid in the left pelvis is slightly larger today. Imaging features remain most suggestive of perforated diverticulitis with para colonic abscess. Perforation of pelvic small bowel is a possibility but considered less likely. 2.  Aortic Atherosclerois (ICD10-170.0) Electronically Signed   By: Misty Stanley M.D.   On: 11/01/2017 14:55    Anti-infectives: Anti-infectives (From admission, onward)   Start     Dose/Rate Route Frequency Ordered Stop   10/29/17 2300  piperacillin-tazobactam (ZOSYN) IVPB 3.375 g     3.375 g 12.5 mL/hr over 240 Minutes Intravenous Every 8 hours 10/29/17 2108     10/29/17 1730  piperacillin-tazobactam (ZOSYN) IVPB 3.375 g     3.375 g 100 mL/hr over 30 Minutes Intravenous  Once 10/29/17 1723 10/29/17 1843       Assessment/Plan Diverticulitis with microperforation  - CT 7/7:fat stranding adjacent to the proximal sigmoid colon with some mild wall thickening. There is a pocket of extraluminal fluid and gas measuring up to 1.9 cm - CT 7/10: Similar appearance of edema/inflammation in the pelvis with a left-sided predominance. Extraluminal collection of gas and fluid in the left pelvis is slightly larger (2.6 cm) - WBC 9.6,  afebrole -tolerating clears with the exception of apple juice, advance to to FLD  FEN:FLD, decrease IVF VTE: SCDs, lovenox ID: IV zosyn 7/7>>    LOS: 4 days    Brigid Re , [facility] Surgery 11/02/2017, 8:39 AM Pager: [phone] Consults: [phone] Mon-Fri 7:00 am-4:30 pm Sat-Sun 7:00 am-11:30 am

## 2017-11-03 LAB — CBC
HCT: 37.5 % (ref 36.0–46.0)
HEMOGLOBIN: 12.5 g/dL (ref 12.0–15.0)
MCH: 28.9 pg (ref 26.0–34.0)
MCHC: 33.3 g/dL (ref 30.0–36.0)
MCV: 86.8 fL (ref 78.0–100.0)
Platelets: 259 10*3/uL (ref 150–400)
RBC: 4.32 MIL/uL (ref 3.87–5.11)
RDW: 12.9 % (ref 11.5–15.5)
WBC: 7.6 10*3/uL (ref 4.0–10.5)

## 2017-11-03 LAB — BASIC METABOLIC PANEL
Anion gap: 8 (ref 5–15)
CO2: 29 mmol/L (ref 22–32)
CREATININE: 0.74 mg/dL (ref 0.44–1.00)
Calcium: 8.7 mg/dL — ABNORMAL LOW (ref 8.9–10.3)
Chloride: 101 mmol/L (ref 98–111)
GFR calc Af Amer: >60 mL/min (ref >60)
GFR calc non Af Amer: >60 mL/min (ref >60)
GLUCOSE: 114 mg/dL — AB (ref 70–99)
POTASSIUM: 3 mmol/L — AB (ref 3.5–5.1)
Sodium: 138 mmol/L (ref 135–145)

## 2017-11-03 LAB — MAGNESIUM: Magnesium: 1.7 mg/dL (ref 1.7–2.4)

## 2017-11-03 MED ORDER — KCL IN DEXTROSE-NACL 40-5-0.9 MEQ/L-%-% IV SOLN
INTRAVENOUS | Status: DC
  Administered 2017-11-03: 11:00:00 via INTRAVENOUS
  Filled 2017-11-03 (×2): qty 1000

## 2017-11-03 MED ORDER — POTASSIUM CHLORIDE CRYS ER 20 MEQ PO TBCR
20.0000 meq | EXTENDED_RELEASE_TABLET | Freq: Once | ORAL | Status: AC
  Administered 2017-11-03: 20 meq via ORAL
  Filled 2017-11-03: qty 1

## 2017-11-03 NOTE — Progress Notes (Addendum)
CC:  Abdominal pain   Subjective: She is actually having more discomfort last night and this morning than she did day prior.  She has not had a good bowel movement although she has had a couple little once yesterday when she was on clears.  She is not taking a lot of the full liquids because of increased discomfort.  She still soft, on exam.  Pain is in the mid abdomen.  Still has bowel sounds.  No acute changes.  Objective: Vital signs in last 24 hours: Temp:  [98.4 F (36.9 C)-99.2 F (37.3 C)] 99.2 F (37.3 C) (07/12 0445) Pulse Rate:  [57-74] 62 (07/12 0445) Resp:  [18] 18 (07/12 0445) BP: (132-145)/(66-71) 132/68 (07/12 0445) SpO2:  [98 %-99 %] 99 % (07/12 0445) Last BM Date: 11/03/17 120 PO recorded 2000 IV Urine x 3 BM x 2 Afebrile, TM 99.2 K+ 3.0 - Mag ordered WBC 7.6 CT 7/10:  Colon is nondilated. Diverticular changes are noted in the left colon. Wall thickening with pericolonic edema/inflammation again noted in the proximal sigmoid segment. The extraluminal collection of gas and fluid in the left lower quadrant persists and measures 2.6 cm today compared to 1.9 cm previously. This collection is contiguous with a loop of small bowel the demonstrates wall thickening, presumably secondary.   Intake/Output from previous day: 07/11 0701 - 07/12 0700 In: 2254.6 [P.O.:120; I.V.:1981.3; IV Piggyback:153.4] Out: -  Intake/Output this shift: No intake/output data recorded.  General appearance: alert, cooperative and no distress Resp: clear to auscultation bilaterally GI: Her abdomen soft, she does have bowel sounds.  She had a very small bowel movement yesterday a.m. but think she needs a much larger bowel movement.  She has some discomfort and cramping pains that occur in her mid abdomen.  This was worse last night and this a.m. then during the day yesterday.  Lab Results:  Recent Labs    11/02/17 0906 11/03/17 0423  WBC 9.6 7.6  HGB 12.8 12.5  HCT 38.1 37.5   PLT 258 259    BMET Recent Labs    11/02/17 0906 11/03/17 0423  NA 135 138  K 3.8 3.0*  CL 98 101  CO2 26 29  GLUCOSE 122* 114*  BUN <5* <5*  CREATININE 0.62 0.74  CALCIUM 8.6* 8.7*   PT/INR No results for input(s): LABPROT, INR in the last 72 hours.  Recent Labs  Lab 10/29/17 1542  AST 24  ALT 15  ALKPHOS 55  BILITOT 1.7*  PROT 7.7  ALBUMIN 3.9     Lipase     Component Value Date/Time   LIPASE 26 10/29/2017 1542   Prior to Admission medications   Medication Sig Start Date End Date Taking? Authorizing Provider  ALPRAZolam (XANAX) 0.25 MG tablet Take 1 tablet (0.25 mg total) by mouth at bedtime as needed for anxiety or sleep. Patient taking differently: Take 0.25 mg by mouth at bedtime.  09/14/17 09/14/18 Yes Eksir, Richard Miu, MD  Calcium-Magnesium-Zinc 500-250-12.5 MG TABS Take 1 tablet by mouth 3 (three) times daily.   Yes [provider]  cholecalciferol (VITAMIN D) 1000 units tablet Take 1,000 Units by mouth daily.   Yes [provider]  co-enzyme Q-10 50 MG capsule Take 50 mg by mouth daily.   Yes [provider]  COD LIVER OIL PO Take 228 mg by mouth daily.    Yes [provider]  dicyclomine (BENTYL) 20 MG tablet Take 20 mg by mouth 2 (two) times daily  as needed for spasms.    Yes [provider]  OMEGA 3-6-9 FATTY ACIDS PO Take 1 capsule by mouth daily.    Yes [provider]  polyethylene glycol (MIRALAX / GLYCOLAX) packet Take 17 g by mouth daily.   Yes [provider]  Probiotic Product (ALIGN PO) Take 1 capsule by mouth daily.    Yes [provider]  Selenium 200 MCG CAPS Take 200 mcg by mouth daily.    Yes [provider]  ALOE VERA JUICE LIQD Take 2 oz by mouth.    [provider]  Biotin 5 MG CAPS Take by mouth.    [provider]  Calcium-Magnesium-Vitamin D (CALCIUM 500 PO) Take 500 mg by mouth.    [provider]  cyanocobalamin 500 MCG  tablet Take 500 mcg by mouth daily.    [provider]  Magnesium 250 MG TABS Take 250 mg by mouth.    [provider]  pantoprazole (PROTONIX) 40 MG tablet Take 40 mg by mouth daily.    [provider]     Medications: . docusate sodium  100 mg Oral BID  . enoxaparin (LOVENOX) injection  40 mg Subcutaneous QHS  . famotidine  20 mg Oral Daily   . sodium chloride 50 mL/hr at 11/02/17 0945  . piperacillin-tazobactam (ZOSYN)  IV 3.375 g (11/03/17 0556)   Anti-infectives (From admission, onward)   Start     Dose/Rate Route Frequency Ordered Stop   10/29/17 2300  piperacillin-tazobactam (ZOSYN) IVPB 3.375 g     3.375 g 12.5 mL/hr over 240 Minutes Intravenous Every 8 hours 10/29/17 2108     10/29/17 1730  piperacillin-tazobactam (ZOSYN) IVPB 3.375 g     3.375 g 100 mL/hr over 30 Minutes Intravenous  Once 10/29/17 1723 10/29/17 1843      Assessment/Plan Diverticulitis with microperforation  - CT 7/7:fat stranding adjacent to the proximal sigmoid colon with some mild wall thickening. There is a pocket of extraluminal fluid and gas measuring up to 1.9 cm - CT 7/10: Similar appearance of edema/inflammation in the pelvis with a left-sided predominance. Extraluminal collection of gas and fluid in the left pelvis is slightly larger (2.6 cm) increased from 1.9 CM - WBC 9.6, afebrile -tolerating clears with the exception of apple juice, advance to to FLD  -Slight increase in pain; WBC 7.6  =>>11/03/2017 -on full liquids  Hypokalemia - check mag/replace K+ - labs in AM  FEN:FLD, decrease IVF VTE: SCDs, lovenox ID: IV zosyn 7/7>>  Day 6  Plan: I will leave her on the full liquid diet.  IV fluids at 50 ml/hr.  Continue Zosyn.       LOS: 5 days    Nikola Marone 11/03/2017 [phone]

## 2017-11-03 NOTE — Care Management Important Message (Signed)
Important Message  Patient Details  Name: Alexandria Chapman MRN: [id number] Date of Birth: [birth year]   Medicare Important Message Given:  Yes    Kerin Salen 11/03/2017, 11:09 AMImportant Message  Patient Details  Name: Alexandria Chapman MRN: [id number] Date of Birth: [birth year]   Medicare Important Message Given:  Yes    Kerin Salen 11/03/2017, 11:09 AM

## 2017-11-04 DIAGNOSIS — G4700 Insomnia, unspecified: Secondary | ICD-10-CM

## 2017-11-04 DIAGNOSIS — F419 Anxiety disorder, unspecified: Secondary | ICD-10-CM | POA: Diagnosis present

## 2017-11-04 LAB — BASIC METABOLIC PANEL
Anion gap: 6 (ref 5–15)
BUN: <5 mg/dL — ABNORMAL LOW (ref 8–23)
CHLORIDE: 102 mmol/L (ref 98–111)
CO2: 29 mmol/L (ref 22–32)
CREATININE: 0.66 mg/dL (ref 0.44–1.00)
Calcium: 8.7 mg/dL — ABNORMAL LOW (ref 8.9–10.3)
GFR calc non Af Amer: >60 mL/min (ref >60)
Glucose, Bld: 111 mg/dL — ABNORMAL HIGH (ref 70–99)
Potassium: 3.7 mmol/L (ref 3.5–5.1)
Sodium: 137 mmol/L (ref 135–145)

## 2017-11-04 MED ORDER — HYDROCORTISONE 1 % EX CREA: 1.0000 "application " | TOPICAL_CREAM | Freq: Three times a day (TID) | CUTANEOUS | Status: DC | PRN

## 2017-11-04 MED ORDER — SODIUM CHLORIDE 0.9% FLUSH: 3.0000 mL | INTRAVENOUS | Status: DC | PRN

## 2017-11-04 MED ORDER — SACCHAROMYCES BOULARDII 250 MG PO CAPS
250.0000 mg | ORAL_CAPSULE | Freq: Two times a day (BID) | ORAL | Status: DC
  Administered 2017-11-04 – 2017-11-05 (×3): 250 mg via ORAL
  Filled 2017-11-04 (×3): qty 1

## 2017-11-04 MED ORDER — SODIUM CHLORIDE 0.9 % IV SOLN
250.0000 mL | INTRAVENOUS | Status: DC | PRN
Start: 2017-11-04 — End: 2017-11-05

## 2017-11-04 MED ORDER — LIP MEDEX EX OINT
1.0000 "application " | TOPICAL_OINTMENT | Freq: Two times a day (BID) | CUTANEOUS | Status: DC
  Administered 2017-11-04 – 2017-11-05 (×3): 1 via TOPICAL
  Filled 2017-11-04 (×2): qty 7

## 2017-11-04 MED ORDER — PROCHLORPERAZINE EDISYLATE 10 MG/2ML IJ SOLN: 5.0000 mg | INTRAMUSCULAR | Status: DC | PRN

## 2017-11-04 MED ORDER — HYDROCORTISONE 2.5 % RE CREA: 1.0000 "application " | TOPICAL_CREAM | Freq: Four times a day (QID) | RECTAL | Status: DC | PRN

## 2017-11-04 MED ORDER — MAGNESIUM OXIDE 400 (241.3 MG) MG PO TABS
200.0000 mg | ORAL_TABLET | Freq: Every day | ORAL | Status: DC
  Administered 2017-11-04 – 2017-11-05 (×2): 200 mg via ORAL
  Filled 2017-11-04 (×2): qty 1

## 2017-11-04 MED ORDER — SODIUM CHLORIDE 0.9% FLUSH
3.0000 mL | Freq: Two times a day (BID) | INTRAVENOUS | Status: DC
  Administered 2017-11-04 – 2017-11-05 (×3): 3 mL via INTRAVENOUS

## 2017-11-04 MED ORDER — VITAMIN B-12 1000 MCG PO TABS
500.0000 ug | ORAL_TABLET | Freq: Every day | ORAL | Status: DC
  Administered 2017-11-04 – 2017-11-05 (×2): 500 ug via ORAL
  Filled 2017-11-04 (×2): qty 1

## 2017-11-04 MED ORDER — PSYLLIUM 95 % PO PACK: 1.0000 | PACK | Freq: Every day | ORAL | Status: DC

## 2017-11-04 MED ORDER — POLYETHYLENE GLYCOL 3350 17 G PO PACK
17.0000 g | PACK | Freq: Every day | ORAL | Status: DC
  Administered 2017-11-04 – 2017-11-05 (×2): 17 g via ORAL
  Filled 2017-11-04 (×2): qty 1

## 2017-11-04 MED ORDER — ENSURE SURGERY PO LIQD
237.0000 mL | Freq: Two times a day (BID) | ORAL | Status: DC
  Administered 2017-11-04 – 2017-11-05 (×3): 237 mL via ORAL
  Filled 2017-11-04 (×4): qty 237

## 2017-11-04 MED ORDER — SELENIUM 200 MCG PO CAPS: 200.0000 ug | ORAL_CAPSULE | Freq: Every day | ORAL | Status: DC

## 2017-11-04 MED ORDER — PHENOL 1.4 % MT LIQD: 1.0000 | OROMUCOSAL | Status: DC | PRN

## 2017-11-04 MED ORDER — ALUM & MAG HYDROXIDE-SIMETH 200-200-20 MG/5ML PO SUSP: 30.0000 mL | Freq: Four times a day (QID) | ORAL | Status: DC | PRN

## 2017-11-04 MED ORDER — MAGIC MOUTHWASH
15.0000 mL | Freq: Four times a day (QID) | ORAL | Status: DC | PRN
  Filled 2017-11-04: qty 15

## 2017-11-04 MED ORDER — HYDROMORPHONE HCL 1 MG/ML IJ SOLN: 0.5000 mg | INTRAMUSCULAR | Status: DC | PRN

## 2017-11-04 MED ORDER — GUAIFENESIN-DM 100-10 MG/5ML PO SYRP: 10.0000 mL | ORAL_SOLUTION | ORAL | Status: DC | PRN

## 2017-11-04 MED ORDER — MENTHOL 3 MG MT LOZG: 1.0000 | LOZENGE | OROMUCOSAL | Status: DC | PRN

## 2017-11-04 MED ORDER — ACETAMINOPHEN 500 MG PO TABS
1000.0000 mg | ORAL_TABLET | Freq: Three times a day (TID) | ORAL | Status: DC
  Administered 2017-11-04 – 2017-11-05 (×2): 1000 mg via ORAL
  Filled 2017-11-04 (×3): qty 2

## 2017-11-04 MED ORDER — DICYCLOMINE HCL 20 MG PO TABS
20.0000 mg | ORAL_TABLET | Freq: Three times a day (TID) | ORAL | Status: DC
  Administered 2017-11-04 – 2017-11-05 (×3): 20 mg via ORAL
  Filled 2017-11-04 (×4): qty 1

## 2017-11-04 MED ORDER — LACTATED RINGERS IV BOLUS
1000.0000 mL | Freq: Three times a day (TID) | INTRAVENOUS | Status: DC | PRN
Start: 2017-11-04 — End: 2017-11-05

## 2017-11-04 NOTE — Progress Notes (Signed)
Alexandria Chapman [id number] 1945-07-28  CARE TEAM:  PCP: Nicholes Rough, PA-C  Outpatient Care Team: Patient Care Team: Nicholes Rough, Hershal Coria as PCP - General (Physician Assistant)  Inpatient Treatment Team: Treatment Team: Attending Provider: Nolon Nations, MD; Registered Nurse: Yvette Rack, RN; Technician: Dirk Dress, NT; Technician: Sueanne Margarita, NT; Registered Nurse: Heloise Ochoa, RN; Registered Nurse: Vicente Serene, RN   Problem List:   Principal Problem:   Colonic diverticular abscess Active Problems:   Diverticulitis   Anxiety   Irritable bowel syndrome with constipation   Insomnia   Gastroesophageal reflux disease   Generalized anxiety disorder      * No surgery found [facility] Stay = 6 days  Assessment  Slowly improving  Plan:  Diverticulitis with microperforation  - CT 7/7:fat stranding adjacent to the proximal sigmoid colon with some mild wall thickening. There is a pocket of extraluminal fluid and gas measuring up to 1.9 cm - CT 7/10:Similar appearance of edema/inflammation in the pelvis with a left-sided predominance. Extraluminal collection of gas and fluid in the left pelvis is slightly larger(2.6 cm) increased from 1.9 CM -Leukocytosis resolved, afebrile   Hypokalemia -improved after replacement  FEN:Try soft diet.  Follow off IV fluids.  PRN boluses. GI: Standing antispasmodics given her history of irritable bowel.  Low-dose MiraLAX and probiotics. VTE: SCDs, lovenox ID: IV zosyn 7/7>>  mobilize as tolerated to help recovery.  Walking well in hallways.  20 minutes spent in review, evaluation, examination, counseling, and coordination of care.  More than 50% of that time was spent in counseling.  Continue plan for now.  If tolerates solid diet and meets goals, transition to oral antibiotics with close follow-up.  If worse, consider repeat CT scanning to rule out abscess.  Hopefully need for urgent Hartmann resection less likely  now.  11/04/2017    Subjective: (Chief complaint)  Had episode of abdominal cramping last night that concerned her.  A little less intense.  Had good bowel movement this morning.  Feeling better today.  No nausea or vomiting.  Walking hallways well.  Objective:  Vital signs:  Vitals:   11/03/17 0445 11/03/17 1423 11/03/17 2042 11/04/17 0546  BP: 132/68 140/68 137/67 128/71  Pulse: 62 67 72 (!) 58  Resp: 18 18 17 16   Temp: 99.2 F (37.3 C) 97.8 F (36.6 C) 97.9 F (36.6 C) 98.6 F (37 C)  TempSrc: Oral Oral Oral Oral  SpO2: 99% 100% 100% 99%  Weight:      Height:        Last BM Date: 11/03/17  Intake/Output   Yesterday:  07/12 0701 - 07/13 0700 In: 2325.2 [P.O.:600; I.V.:1527.5; IV Piggyback:197.7] Out: -  This shift:  No intake/output data recorded.  Bowel function:  Flatus: YES  BM:  YES  Drain: (No drain)   Physical Exam:  General: Pt awake/alert/oriented x4 in no acute distress Eyes: PERRL, normal EOM.  Sclera clear.  No icterus Neuro: CN II-XII intact w/o focal sensory/motor deficits. Lymph: No head/neck/groin lymphadenopathy Psych:  No delerium/psychosis/paranoia HENT: Normocephalic, Mucus membranes moist.  No thrush Neck: Supple, No tracheal deviation Chest: No chest wall pain w good excursion CV:  Pulses intact.  Regular rhythm MS: Normal AROM mjr joints.  No obvious deformity  Abdomen: Soft.  Mildy distended.  Tenderness at Right lower quadrant only.  Relatively mild.  No guarding..  No evidence of peritonitis.  No incarcerated hernias.  Ext:  No deformity.  No mjr  edema.  No cyanosis Skin: No petechiae / purpura  Results:   Labs: Results for orders placed or performed during the hospital encounter of 10/29/17 (from the past 48 hour(s))  CBC     Status: None   Collection Time: 11/03/17  4:23 AM  Result Value Ref Range   WBC 7.6 4.0 - 10.5 K/uL   RBC 4.32 3.87 - 5.11 MIL/uL   Hemoglobin 12.5 12.0 - 15.0 g/dL   HCT 37.5 36.0 -  46.0 %   MCV 86.8 78.0 - 100.0 fL   MCH 28.9 26.0 - 34.0 pg   MCHC 33.3 30.0 - 36.0 g/dL   RDW 12.9 11.5 - 15.5 %   Platelets 259 150 - 400 K/uL    Comment: Performed at [facility], [place] [address], [place], [place] [address]  Basic metabolic panel     Status: Abnormal   Collection Time: 11/03/17  4:23 AM  Result Value Ref Range   Sodium 138 135 - 145 mmol/L   Potassium 3.0 (L) 3.5 - 5.1 mmol/L    Comment: DELTA CHECK NOTED   Chloride 101 98 - 111 mmol/L    Comment: Please note change in reference range.   CO2 29 22 - 32 mmol/L   Glucose, Bld 114 (H) 70 - 99 mg/dL    Comment: Please note change in reference range.   BUN <5 (L) 8 - 23 mg/dL    Comment: Please note change in reference range.   Creatinine, Ser 0.74 0.44 - 1.00 mg/dL   Calcium 8.7 (L) 8.9 - 10.3 mg/dL   GFR calc non Af Amer >60 >60 mL/min   GFR calc Af Amer >60 >60 mL/min    Comment: (NOTE) The eGFR has been calculated using the CKD EPI equation. This calculation has not been validated in all clinical situations. eGFR's persistently <60 mL/min signify possible Chronic Kidney Disease.    Anion gap 8 5 - 15    Comment: Performed at [facility], [place] [address] [place], [place] [address]  Magnesium     Status: None   Collection Time: 11/03/17  4:23 AM  Result Value Ref Range   Magnesium 1.7 1.7 - 2.4 mg/dL    Comment: Performed at [facility], [place] [address] [place], [place] [address]  Basic metabolic panel     Status: Abnormal   Collection Time: 11/04/17  4:30 AM  Result Value Ref Range   Sodium 137 135 - 145 mmol/L   Potassium 3.7 3.5 - 5.1 mmol/L    Comment: DELTA CHECK NOTED NO VISIBLE HEMOLYSIS    Chloride 102 98 - 111 mmol/L    Comment: Please note change in reference range.   CO2 29 22 - 32 mmol/L   Glucose, Bld 111 (H) 70 - 99 mg/dL    Comment: Please note change in reference range.   BUN <5 (L) 8 - 23 mg/dL    Comment: Please note change  in reference range.   Creatinine, Ser 0.66 0.44 - 1.00 mg/dL   Calcium 8.7 (L) 8.9 - 10.3 mg/dL   GFR calc non Af Amer >60 >60 mL/min   GFR calc Af Amer >60 >60 mL/min    Comment: (NOTE) The eGFR has been calculated using the CKD EPI equation. This calculation has not been validated in all clinical situations. eGFR's persistently <60 mL/min signify possible Chronic Kidney Disease.    Anion gap 6 5 - 15    Comment: Performed at [facility]  [place] [address] [place], [place] [address]    Imaging / Studies: No results found.  Medications / Allergies: per chart  Antibiotics: Anti-infectives (From admission, onward)   Start     Dose/Rate Route Frequency Ordered Stop   10/29/17 2300  piperacillin-tazobactam (ZOSYN) IVPB 3.375 g     3.375 g 12.5 mL/hr over 240 Minutes Intravenous Every 8 hours 10/29/17 2108     10/29/17 1730  piperacillin-tazobactam (ZOSYN) IVPB 3.375 g     3.375 g 100 mL/hr over 30 Minutes Intravenous  Once 10/29/17 1723 10/29/17 1843        Note: Portions of this report may have been transcribed using voice recognition software. Every effort was made to ensure accuracy; however, inadvertent computerized transcription errors may be present.   Any transcriptional errors that result from this process are unintentional.     Adin Hector, MD, FACS, MASCRS Gastrointestinal and Minimally Invasive Surgery    [address] [place], [place] [address] [phone] Main / Paging [phone] Fax

## 2017-11-04 NOTE — Progress Notes (Signed)
PHARMACIST - PHYSICIAN ORDER COMMUNICATION  CONCERNING: P&T Medication Policy on Herbal Medications  DESCRIPTION:  This patient's order for:  Selenium has been noted.  This product(s) is classified as an "herbal" or natural product. Due to a lack of definitive safety studies or FDA approval, nonstandard manufacturing practices, plus the potential risk of unknown drug-drug interactions while on inpatient medications, the Pharmacy and Therapeutics Committee does not permit the use of "herbal" or natural products of this type within [facility].   ACTION TAKEN: The pharmacy department is unable to verify this order at this time and your patient has been informed of this safety policy. Please reevaluate patient's clinical condition at discharge and address if the herbal or natural product(s) should be resumed at that time.   Lindell Spar, PharmD, BCPS Pager: [phone] 11/04/2017 10:35 AM

## 2017-11-05 MED ORDER — POLYETHYLENE GLYCOL 3350 17 G PO PACK: 17.0000 g | PACK | Freq: Two times a day (BID) | ORAL | 0 refills | Status: DC

## 2017-11-05 MED ORDER — AMOXICILLIN-POT CLAVULANATE 875-125 MG PO TABS: 1.0000 | ORAL_TABLET | Freq: Two times a day (BID) | ORAL | 1 refills | Status: DC

## 2017-11-05 NOTE — Discharge Instructions (Signed)
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  Augmentin for 5 more days  WALK Walk an hour a day.  Control your pain to do that.    CONTROL PAIN Control pain so that you can walk, sleep, tolerate sneezing/coughing, go up/down stairs.  HAVE A BOWEL MOVEMENT DAILY Keep your bowels regular to avoid problems.  OK to try a laxative to override constipation.  OK to use an antidairrheal to slow down diarrhea.  Call if not better after 2 tries  CALL IF YOU HAVE PROBLEMS/CONCERNS Call if you are still struggling despite following these instructions. Call if you have concerns not answered by these instructions  ######################################################################   DIET Follow a light diet the first few days at home.  Start with a bland diet such as soups, liquids, starchy foods, low fat foods, etc.  If you feel full, bloated, or constipated, stay on a ful liquid or pureed/blenderized diet for a few days until you feel better and no longer constipated. Be sure to drink plenty of fluids every day to avoid getting dehydrated (feeling dizzy, not urinating, etc.). Gradually add a fiber supplement to your diet over the next week.  Gradually get back to a regular solid diet.  Avoid fast food or heavy meals the first week as you are more likely to get nauseated. It is expected for your digestive tract to need a few months to get back to normal.  It is common for your bowel movements and stools to be irregular.  You will have occasional bloating and cramping that should eventually fade away.  Until you are eating solid food normally, off all pain medications, and back to regular activities; your bowels will not  be normal. Focus on eating a low-fat, high fiber diet the rest of your life (See Getting to [place], below).   ACTIVITIES as tolerated Start light daily activities --- self-care, walking, climbing stairs-- beginning the day after surgery.  Gradually increase activities as tolerated.  Control your pain to be active.  Stop when you are tired.  Ideally, walk several times a day, eventually an hour a day.   Most people are back to most day-to-day activities in a few weeks.  It takes 4-8 weeks to get back to unrestricted, intense activity. If you can walk 30 minutes without difficulty, it is safe to try more intense activity such as jogging, treadmill, bicycling, low-impact aerobics, swimming, etc. Save the most intensive and strenuous activity for last (Usually 4-8 weeks after surgery) such as sit-ups, heavy lifting, contact sports, etc.  Refrain from any intense heavy lifting or straining until you are off narcotics for pain control.  You will have off days, but things should improve week-by-week. DO NOT PUSH THROUGH PAIN.  Let pain be your guide: If it hurts to do something, don't do it.  Pain is your body warning you to avoid that activity for another week until the pain goes down. You may drive when you are no longer taking narcotic prescription pain medication, you can comfortably wear a seatbelt, and you can safely make sudden turns/stops to protect yourself without hesitating due to pain. You may have sexual intercourse when it is comfortable. If it hurts to do something, stop.  MEDICATIONS Take your usually prescribed home medications unless otherwise directed.  Blood thinners:  Usually you can restart any strong blood thinners after the second postoperative day.  It is OK to take aspirin right away.     If you are on strong blood thinners (warfarin/Coumadin, Plavix, Xerelto, Eliquis, Pradaxa, etc), discuss with your surgeon, medicine PCP, and/or cardiologist for instructions on when  to restart the blood thinner & if blood monitoring is needed (PT/INR blood check, etc).     PAIN CONTROL Pain after surgery or related to activity is often due to strain/injury to muscle, tendon, nerves and/or incisions.  This pain is usually short-term and will improve in a few months.  To help speed the process of healing and to get back to regular activity more quickly, DO THE FOLLOWING THINGS TOGETHER: 1. Increase activity gradually.  DO NOT PUSH THROUGH PAIN 2. Use Ice and/or Heat 3. Try Gentle Massage and/or Stretching 4. Take over the counter pain medication 5. Take Narcotic prescription pain medication for more severe pain  Good pain control = faster recovery.  It is better to take more medicine to be more active than to stay in bed all day to avoid medications. 1.  Increase activity gradually Avoid heavy lifting at first, then increase to lifting as tolerated over the next 6 weeks. Do not push through the pain.  Listen to your body and avoid positions and maneuvers than reproduce the pain.  Wait a few days before trying something more intense Walking an hour a day is encouraged to help your body recover faster and more safely.  Start slowly and stop when getting sore.  If you can walk 30 minutes without stopping or pain, you can try more intense activity (running, jogging, aerobics, cycling, swimming, treadmill, sex, sports, weightlifting, etc.) Remember: If it hurts to do it, then dont do it! 2. Use Ice and/or Heat You will have swelling and bruising around the incisions.  This will take several weeks to resolve. Ice packs or heating pads (6-8 times a day, 30-60 minutes at a time) will help sooth soreness & bruising. Some people prefer to use ice alone, heat alone, or alternate between ice & heat.  Experiment and see what works best for you.  Consider trying ice for the first few days to help decrease swelling and bruising; then, switch to heat to help relax sore spots and speed  recovery. Shower every day.  Short baths are fine.  It feels good!  Keep the incisions and wounds clean with soap & water.   3. Try Gentle Massage and/or Stretching Massage at the area of pain many times a day Stop if you feel pain - do not overdo it 4. Take over the counter pain medication This helps the muscle and nerve tissues become less irritable and calm down faster Choose ONE of the following over-the-counter anti-inflammatory medications: Acetaminophen 500mg  tabs (Tylenol) 1-2 pills with every meal and just before bedtime (avoid if you have liver problems or if you have acetaminophen in you narcotic prescription) Naproxen 220mg  tabs (ex. Aleve, Naprosyn) 1-2 pills twice a day (avoid if you have kidney, stomach, IBD, or bleeding problems) Ibuprofen 200mg  tabs (ex. Advil, Motrin) 3-4 pills with every meal and just before bedtime (avoid if you have kidney, stomach, IBD, or bleeding problems)   WHEN TO CALL us [phone] Severe uncontrolled or worsening pain  Fever over 101 F (38.5 C) Concerns with the incision: Worsening pain, redness, rash/hives, swelling, bleeding, or drainage Reactions / problems with new medications (itching, rash, hives, nausea, etc.)  Nausea and/or vomiting Difficulty urinating Difficulty breathing Worsening fatigue, dizziness, lightheadedness, blurred vision Other concerns If you are not getting better after two weeks or are noticing you are getting worse, contact our office [phone] for further advice.  We may need to adjust your medications, re-evaluate you in the office, send you to the emergency room, or see what other things we can do to help. The clinic staff is available to answer your questions during regular business hours (8:30am-5pm).  Please dont hesitate to call and ask to speak to one of our nurses for clinical concerns.    A surgeon from [facility] is always on call at the hospitals 24 hours/day If you have a medical  emergency, go to the nearest emergency room or call 911.  FOLLOW UP in our office One the day of your discharge from the hospital (or the next business weekday), please call [facility] to set up or confirm an appointment to see your surgeon in the office for a follow-up appointment.  Usually it is 2-3 weeks after your surgery.   If you have skin staples at your incision(s), let the office know so we can set up a time in the office for the nurse to remove them (usually around 10 days after surgery). Make sure that you call for appointments the day of discharge (or the next business weekday) from the hospital to ensure a convenient appointment time. IF YOU HAVE DISABILITY OR FAMILY LEAVE FORMS, BRING THEM TO THE OFFICE FOR PROCESSING.  DO NOT GIVE THEM TO YOUR DOCTOR.  [facility] [address] [place], [place], [place]  [address] ? [phone] - Main [phone] - [place],  [phone] - Fax [email or web address]  GETTING TO GOOD BOWEL HEALTH. It is expected for your digestive tract to need a few months to get back to normal.  It is common for your bowel movements and stools to be irregular.  You will have occasional bloating and cramping that should eventually fade away.  Until you are eating solid food normally, off all pain medications, and back to regular activities; your bowels will not be normal.   Avoiding constipation The goal: ONE SOFT BOWEL MOVEMENT A DAY!    Drink plenty of fluids.  Choose water first. TAKE A FIBER SUPPLEMENT EVERY DAY THE REST OF YOUR LIFE During your first week back home, gradually add back a fiber supplement every day Experiment which form you can tolerate.   There are many forms such as powders, tablets, wafers, gummies, etc Psyllium bran (Metamucil), methylcellulose (Citrucel), Miralax or Glycolax, Benefiber, Flax Seed.  Adjust the dose week-by-week (1/2 dose/day to 6 doses a day) until you are moving  your bowels 1-2 times a day.  Cut back the dose or try a different fiber product if it is giving you problems such as diarrhea or bloating. Sometimes a laxative is needed to help jump-start bowels if constipated until the fiber supplement can help regulate your bowels.  If you are tolerating eating & you are farting, it is okay to try a gentle laxative such as double dose MiraLax, prune juice, or Milk of Magnesia.  Avoid using laxatives too often. Stool softeners can sometimes help counteract the constipating effects of narcotic pain medicines.  It can also cause diarrhea, so avoid using for too long. If you are still constipated despite taking fiber daily, eating solids, and a few doses of laxatives, call our office. Controlling diarrhea Try drinking liquids  and eating bland foods for a few days to avoid stressing your intestines further. Avoid dairy products (especially milk & ice cream) for a short time.  The intestines often can lose the ability to digest lactose when stressed. Avoid foods that cause gassiness or bloating.  Typical foods include beans and other legumes, cabbage, broccoli, and dairy foods.  Avoid greasy, spicy, fast foods.  Every person has some sensitivity to other foods, so listen to your body and avoid those foods that trigger problems for you. Probiotics (such as active yogurt, Align, etc) may help repopulate the intestines and colon with normal bacteria and calm down a sensitive digestive tract Adding a fiber supplement gradually can help thicken stools by absorbing excess fluid and retrain the intestines to act more normally.  Slowly increase the dose over a few weeks.  Too much fiber too soon can backfire and cause cramping & bloating. It is okay to try and slow down diarrhea with a few doses of antidiarrheal medicines.   Bismuth subsalicylate (ex. Kayopectate, Pepto Bismol) for a few doses can help control diarrhea.  Avoid if pregnant.   Loperamide (Imodium) can slow down  diarrhea.  Start with one tablet (2mg ) first.  Avoid if you are having fevers or severe pain.  ILEOSTOMY PATIENTS WILL HAVE CHRONIC DIARRHEA since their colon is not in use.    Drink plenty of liquids.  You will need to drink even more glasses of water/liquid a day to avoid getting dehydrated. Record output from your ileostomy.  Expect to empty the bag every 3-4 hours at first.  Most people with a permanent ileostomy empty their bag 4-6 times at the least.   Use antidiarrheal medicine (especially Imodium) several times a day to avoid getting dehydrated.  Start with a dose at bedtime & breakfast.  Adjust up or down as needed.  Increase antidiarrheal medications as directed to avoid emptying the bag more than 8 times a day (every 3 hours). Work with your wound ostomy nurse to learn care for your ostomy.  See ostomy care instructions. TROUBLESHOOTING IRREGULAR BOWELS 1) Start with a soft & bland diet. No spicy, greasy, or fried foods.  2) Avoid gluten/wheat or dairy products from diet to see if symptoms improve. 3) Miralax 17gm or flax seed mixed in [place]. water or juice-daily. May use 2-4 times a day as needed. 4) Gas-X, Phazyme, etc. as needed for gas & bloating.  5) Prilosec (omeprazole) over-the-counter as needed 6)  Consider probiotics (Align, Activa, etc) to help calm the bowels down  Call your doctor if you are getting worse or not getting better.  Sometimes further testing (cultures, endoscopy, X-ray studies, CT scans, bloodwork, etc.) may be needed to help diagnose and treat the cause of the diarrhea. [facility], [place], [place], [place], [place]  [address] [phone] - Main.    [phone]  - Toll Free.   [phone] - Fax [email or web address]     Diverticulitis Diverticulitis is infection or inflammation of small pouches (diverticula) in the colon that form due to a condition called diverticulosis. Diverticula can trap stool (feces)  and bacteria, causing infection and inflammation. Diverticulitis may cause severe stomach pain and diarrhea. It may lead to tissue damage in the colon that causes bleeding. The diverticula may also burst (rupture) and cause infected stool to enter other areas of the abdomen. Complications of diverticulitis can include:  Bleeding.  Severe infection.  Severe pain.  Rupture (perforation) of the colon.  Blockage (obstruction) of the colon.  What are the causes? This condition is caused by stool becoming trapped in the diverticula, which allows bacteria to grow in the diverticula. This leads to inflammation and infection. What increases the risk? You are more likely to develop this condition if:  You have diverticulosis. The risk for diverticulosis increases if: ? You are overweight or obese. ? You use tobacco products. ? You do not get enough exercise.  You eat a diet that does not include enough fiber. High-fiber foods include fruits, vegetables, beans, nuts, and whole grains.  What are the signs or symptoms? Symptoms of this condition may include:  Pain and tenderness in the abdomen. The pain is normally located on the left side of the abdomen, but it may occur in other areas.  Fever and chills.  Bloating.  Cramping.  Nausea.  Vomiting.  Changes in bowel routines.  Blood in your stool.  How is this diagnosed? This condition is diagnosed based on:  Your medical history.  A physical exam.  Tests to make sure there is nothing else causing your condition. These tests may include: ? Blood tests. ? Urine tests. ? Imaging tests of the abdomen, including X-rays, ultrasounds, MRIs, or CT scans.  How is this treated? Most cases of this condition are mild and can be treated at home. Treatment may include:  Taking over-the-counter pain medicines.  Following a clear liquid diet.  Taking antibiotic medicines by mouth.  Rest.  More severe cases may need to be  treated at a hospital. Treatment may include:  Not eating or drinking.  Taking prescription pain medicine.  Receiving antibiotic medicines through an IV tube.  Receiving fluids and nutrition through an IV tube.  Surgery.  When your condition is under control, your health care provider may recommend that you have a colonoscopy. This is an exam to look at the entire large intestine. During the exam, a lubricated, bendable tube is inserted into the anus and then passed into the rectum, colon, and other parts of the large intestine. A colonoscopy can show how severe your diverticula are and whether something else may be causing your symptoms. Follow these instructions at home: Medicines  Take over-the-counter and prescription medicines only as told by your health care provider. These include fiber supplements, probiotics, and stool softeners.  If you were prescribed an antibiotic medicine, take it as told by your health care provider. Do not stop taking the antibiotic even if you start to feel better.  Do not drive or use heavy machinery while taking prescription pain medicine. General instructions  Follow a full liquid diet or another diet as directed by your health care provider. After your symptoms improve, your health care provider may tell you to change your diet. He or she may recommend that you eat a diet that contains at least 25 g (25 grams) of fiber daily. Fiber makes it easier to pass stool. Healthy sources of fiber include: ? Berries. One cup contains 4-8 grams of fiber. ? Beans or lentils. One half cup contains 5-8 grams of fiber. ? Green vegetables. One cup contains 4 grams of fiber.  Exercise for at least 30 minutes, 3 times each week. You should exercise hard enough to raise your heart rate and break a sweat.  Keep all follow-up visits as told by your health care provider. This is important. You may need a colonoscopy. Contact a health care provider if:  Your pain does  not improve.  You have a  hard time drinking or eating food.  Your bowel movements do not return to normal. Get help right away if:  Your pain gets worse.  Your symptoms do not get better with treatment.  Your symptoms suddenly get worse.  You have a fever.  You vomit more than one time.  You have stools that are bloody, black, or tarry. Summary  Diverticulitis is infection or inflammation of small pouches (diverticula) in the colon that form due to a condition called diverticulosis. Diverticula can trap stool (feces) and bacteria, causing infection and inflammation.  You are at higher risk for this condition if you have diverticulosis and you eat a diet that does not include enough fiber.  Most cases of this condition are mild and can be treated at home. More severe cases may need to be treated at a hospital.  When your condition is under control, your health care provider may recommend that you have an exam called a colonoscopy. This exam can show how severe your diverticula are and whether something else may be causing your symptoms. This information is not intended to replace advice given to you by your health care provider. Make sure you discuss any questions you have with your health care provider. Document Released: 01/19/2005 Document Revised: 05/14/2016 Document Reviewed: 05/14/2016 Elsevier Interactive Patient Education  [employer].

## 2017-11-05 NOTE — Discharge Summary (Signed)
Physician Discharge Summary    Patient ID: Alexandria Chapman MRN: [id number] DOB/AGE: 1946/03/08  72 y.o.  Admit date: 10/29/2017 Discharge date: 11/05/2017   Hospital Stay = 7 days  Patient Care Team: Nicholes Rough, PA-C as PCP - General (Physician Assistant) Michael Boston, MD as Consulting Physician (General Surgery) Daron Offer, Richard Miu, MD as Consulting Physician (Psychiatry) Daryll Brod, MD as Consulting Physician (Orthopedic Surgery)  Discharge Diagnoses:  Principal Problem:   Colonic diverticular abscess Active Problems:   Diverticulitis   Anxiety   Irritable bowel syndrome with constipation   Insomnia   Gastroesophageal reflux disease   Generalized anxiety disorder      Consults: None  Hospital Course:   Pleasant woman with a history of irritable bowel and constipation who had crampy abdominal pain and found to have diverticulitis of her colon with microperforation.  She was admitted.  Had intermittent sharp crampy abdominal pains that gradually resolved.  Ileus resolved and began to have bowel movements.  She was advanced on her diet.  She was afebrile with no more leukocytosis.  Felt remarkably better.    By the time of discharge, the patient was walking well the hallways, eating food, having flatus.  Pain was well-controlled on an oral medications.  Based on meeting discharge criteria and continuing to recover, I felt it was safe for the patient to be discharged from the hospital to further recover with close followup.  Likely she would benefit from colonoscopy to rule out any intraluminal mass and tumor.  Discussed the need for elective colonic resection to prevent a future attacks of diverticulitis based on prior events and complexity.  This is her second episode of diverticulitis with perforation and fluid.  Has had prior colonoscopies that have been underwhelming but I do not know how recently.  This can be discussed in the office.  Postoperative recommendations were  discussed in detail.  They are written as well.  Discharged Condition: good  Disposition:  Follow-up Information    Michael Boston, MD. Schedule an appointment as soon as possible for a visit in 2 week(s).   Specialty:  General Surgery Why:  Return to surgeon's office to make sure you continue to recover from your diverticulitis. Contact information: [address] [place] [address] [phone]           Discharge disposition: 01-Home or Self Care       Discharge Instructions    Call MD for:   Complete by:  As directed    FEVER > 101.5 F  (temperatures < 101.5 F are not significant)   Call MD for:  extreme fatigue   Complete by:  As directed    Call MD for:  persistant dizziness or light-headedness   Complete by:  As directed    Call MD for:  persistant nausea and vomiting   Complete by:  As directed    Call MD for:  redness, tenderness, or signs of infection (pain, swelling, redness, odor or green/yellow discharge around incision site)   Complete by:  As directed    Call MD for:  severe uncontrolled pain   Complete by:  As directed    Diet - low sodium heart healthy   Complete by:  As directed    Start with a bland diet such as soups, liquids, starchy foods, low fat foods, etc. the first few days at home. Gradually advance to a solid, low-fat, high fiber diet by the end of the first week at home.  Add a fiber supplement to your diet (Metamucil, etc) If you feel full, bloated, or constipated, stay on a full liquid or pureed/blenderized diet for a few days until you feel better and are no longer constipated.   Discharge instructions   Complete by:  As directed    See Discharge Instructions If you are not getting better after two weeks or are noticing you are getting worse, contact our office [phone] for further advice.  We may need to adjust your medications, re-evaluate you in the office, send you to the emergency room, or see what other  things we can do to help. The clinic staff is available to answer your questions during regular business hours (8:30am-5pm).  Please don't hesitate to call and ask to speak to one of our nurses for clinical concerns.    A surgeon from [facility] is always on call at the hospitals 24 hours/day If you have a medical emergency, go to the nearest emergency room or call 911.   Discharge wound care:   Complete by:  As directed    It is good for closed incisions and even open wounds to be washed every day.  Shower every day.  Short baths are fine.  Wash the incisions and wounds clean with soap & water.     You may leave closed incisions open to air if it is dry.   You may cover the incision with clean gauze & replace it after your daily shower for comfort.   If you have skin tapes (Steristrips) or skin glue (Dermabond) on your incision, leave them in place.  They will fall off on their own like a scab.  You may trim any edges that curl up with clean scissors.    If you have skin staples, set up an appointment for them to be removed in the office in 10 days after surgery.  If you have a drain, wash around the skin exit site with soap & water and place a new dressing of gauze or band aid around the skin every day.  Keep the drain site clean & dry.   Driving Restrictions   Complete by:  As directed    You may drive when: - you are no longer taking narcotic prescription pain medication - you can comfortably wear a seatbelt - you can safely make sudden turns/stops without pain.   Increase activity slowly   Complete by:  As directed    Start light daily activities --- self-care, walking, climbing stairs- beginning the day after surgery.  Gradually increase activities as tolerated.  Control your pain to be active.  Stop when you are tired.  Ideally, walk several times a day, eventually an hour a day.   Most people are back to most day-to-day activities in a few weeks.  It takes 4-6 weeks to  get back to unrestricted, intense activity. If you can walk 30 minutes without difficulty, it is safe to try more intense activity such as jogging, treadmill, bicycling, low-impact aerobics, swimming, etc. Save the most intensive and strenuous activity for last (Usually 4-8 weeks after surgery) such as sit-ups, heavy lifting, contact sports, etc.  Refrain from any intense heavy lifting or straining until you are off narcotics for pain control.  You will have off days, but things should improve week-by-week. DO NOT PUSH THROUGH PAIN.  Let pain be your guide: If it hurts to do something, don't do it.   Lifting restrictions   Complete by:  As  directed    If you can walk 30 minutes without difficulty, it is safe to try more intense activity such as jogging, treadmill, bicycling, low-impact aerobics, swimming, etc. Save the most intensive and strenuous activity for last (Usually 4-8 weeks after surgery) such as sit-ups, heavy lifting, contact sports, etc.   Refrain from any intense heavy lifting or straining until you are off narcotics for pain control.  You will have off days, but things should improve week-by-week. DO NOT PUSH THROUGH PAIN.  Let pain be your guide: If it hurts to do something, don't do it.  Pain is your body warning you to avoid that activity for another week until the pain goes down.   May shower / Bathe   Complete by:  As directed    May walk up steps   Complete by:  As directed    Sexual Activity Restrictions   Complete by:  As directed    You may have sexual intercourse when it is comfortable. If it hurts to do something, stop.      Allergies as of 11/05/2017      Reactions   Sulfa Antibiotics Anaphylaxis   Erythromycin    Nsaids Nausea And Vomiting   Sucralfate Nausea Only   Sudafed [pseudoephedrine Hcl] Other (See Comments)   dizziness   Voltaren [diclofenac Sodium] Other (See Comments)   gastrointestinal       Medication List    TAKE these medications   ALIGN  PO Take 1 capsule by mouth daily.   ALOE VERA JUICE Liqd Take 2 oz by mouth.   ALPRAZolam 0.25 MG tablet Commonly known as:  XANAX Take 1 tablet (0.25 mg total) by mouth at bedtime as needed for anxiety or sleep. What changed:  when to take this   amoxicillin-clavulanate 875-125 MG tablet Commonly known as:  AUGMENTIN Take 1 tablet by mouth 2 (two) times daily.   Biotin 5 MG Caps Take by mouth.   CALCIUM 500 PO Take 500 mg by mouth.   Calcium-Magnesium-Zinc 500-250-12.5 MG Tabs Take 1 tablet by mouth 3 (three) times daily.   cholecalciferol 1000 units tablet Commonly known as:  VITAMIN D Take 1,000 Units by mouth daily.   co-enzyme Q-10 50 MG capsule Take 50 mg by mouth daily.   COD LIVER OIL PO Take 228 mg by mouth daily.   dicyclomine 20 MG tablet Commonly known as:  BENTYL Take 20 mg by mouth 2 (two) times daily as needed for spasms.   Magnesium 250 MG Tabs Take 250 mg by mouth.   OMEGA 3-6-9 FATTY ACIDS PO Take 1 capsule by mouth daily.   pantoprazole 40 MG tablet Commonly known as:  PROTONIX Take 40 mg by mouth daily.   polyethylene glycol packet Commonly known as:  MIRALAX / GLYCOLAX Take 17 g by mouth 2 (two) times daily. What changed:  when to take this   Selenium 200 MCG Caps Take 200 mcg by mouth daily.   vitamin B-12 500 MCG tablet Commonly known as:  CYANOCOBALAMIN Take 500 mcg by mouth daily.            Discharge Care Instructions  (From admission, onward)        Start     Ordered   11/05/17 0000  Discharge wound care:    Comments:  It is good for closed incisions and even open wounds to be washed every day.  Shower every day.  Short baths are fine.  Wash the incisions and wounds clean  with soap & water.     You may leave closed incisions open to air if it is dry.   You may cover the incision with clean gauze & replace it after your daily shower for comfort.   If you have skin tapes (Steristrips) or skin glue (Dermabond) on  your incision, leave them in place.  They will fall off on their own like a scab.  You may trim any edges that curl up with clean scissors.    If you have skin staples, set up an appointment for them to be removed in the office in 10 days after surgery.  If you have a drain, wash around the skin exit site with soap & water and place a new dressing of gauze or band aid around the skin every day.  Keep the drain site clean & dry.   11/05/17 0804      Significant Diagnostic Studies:  Results for orders placed or performed during the hospital encounter of 10/29/17 (from the past 72 hour(s))  CBC     Status: None   Collection Time: 11/02/17  9:06 AM  Result Value Ref Range   WBC 9.6 4.0 - 10.5 K/uL   RBC 4.42 3.87 - 5.11 MIL/uL   Hemoglobin 12.8 12.0 - 15.0 g/dL   HCT 38.1 36.0 - 46.0 %   MCV 86.2 78.0 - 100.0 fL   MCH 29.0 26.0 - 34.0 pg   MCHC 33.6 30.0 - 36.0 g/dL   RDW 12.8 11.5 - 15.5 %   Platelets 258 150 - 400 K/uL    Comment: Performed at [facility], [place] [address] [place], [place] [address]  Basic metabolic panel     Status: Abnormal   Collection Time: 11/02/17  9:06 AM  Result Value Ref Range   Sodium 135 135 - 145 mmol/L   Potassium 3.8 3.5 - 5.1 mmol/L   Chloride 98 98 - 111 mmol/L    Comment: Please note change in reference range.   CO2 26 22 - 32 mmol/L   Glucose, Bld 122 (H) 70 - 99 mg/dL    Comment: Please note change in reference range.   BUN <5 (L) 8 - 23 mg/dL    Comment: Please note change in reference range.   Creatinine, Ser 0.62 0.44 - 1.00 mg/dL   Calcium 8.6 (L) 8.9 - 10.3 mg/dL   GFR calc non Af Amer >60 >60 mL/min   GFR calc Af Amer >60 >60 mL/min    Comment: (NOTE) The eGFR has been calculated using the CKD EPI equation. This calculation has not been validated in all clinical situations. eGFR's persistently <60 mL/min signify possible Chronic Kidney Disease.    Anion gap 11 5 - 15    Comment: Performed at [facility], [place] [address] [place], [place] [address]  CBC     Status: None   Collection Time: 11/03/17  4:23 AM  Result Value Ref Range   WBC 7.6 4.0 - 10.5 K/uL   RBC 4.32 3.87 - 5.11 MIL/uL   Hemoglobin 12.5 12.0 - 15.0 g/dL   HCT 37.5 36.0 - 46.0 %   MCV 86.8 78.0 - 100.0 fL   MCH 28.9 26.0 - 34.0 pg   MCHC 33.3 30.0 - 36.0 g/dL   RDW 12.9 11.5 - 15.5 %   Platelets 259 150 - 400 K/uL    Comment: Performed at [facility], [place] [address], [place], [place] [address]  Basic  metabolic panel     Status: Abnormal   Collection Time: 11/03/17  4:23 AM  Result Value Ref Range   Sodium 138 135 - 145 mmol/L   Potassium 3.0 (L) 3.5 - 5.1 mmol/L    Comment: DELTA CHECK NOTED   Chloride 101 98 - 111 mmol/L    Comment: Please note change in reference range.   CO2 29 22 - 32 mmol/L   Glucose, Bld 114 (H) 70 - 99 mg/dL    Comment: Please note change in reference range.   BUN <5 (L) 8 - 23 mg/dL    Comment: Please note change in reference range.   Creatinine, Ser 0.74 0.44 - 1.00 mg/dL   Calcium 8.7 (L) 8.9 - 10.3 mg/dL   GFR calc non Af Amer >60 >60 mL/min   GFR calc Af Amer >60 >60 mL/min    Comment: (NOTE) The eGFR has been calculated using the CKD EPI equation. This calculation has not been validated in all clinical situations. eGFR's persistently <60 mL/min signify possible Chronic Kidney Disease.    Anion gap 8 5 - 15    Comment: Performed at [facility], [place] [address], [place], [place] [address]  Magnesium     Status: None   Collection Time: 11/03/17  4:23 AM  Result Value Ref Range   Magnesium 1.7 1.7 - 2.4 mg/dL    Comment: Performed at [facility], [place] [address] [place], [place] [address]  Basic metabolic panel     Status: Abnormal   Collection Time: 11/04/17  4:30 AM  Result Value Ref Range   Sodium 137 135 - 145 mmol/L   Potassium 3.7 3.5 - 5.1 mmol/L    Comment: DELTA CHECK NOTED NO VISIBLE HEMOLYSIS     Chloride 102 98 - 111 mmol/L    Comment: Please note change in reference range.   CO2 29 22 - 32 mmol/L   Glucose, Bld 111 (H) 70 - 99 mg/dL    Comment: Please note change in reference range.   BUN <5 (L) 8 - 23 mg/dL    Comment: Please note change in reference range.   Creatinine, Ser 0.66 0.44 - 1.00 mg/dL   Calcium 8.7 (L) 8.9 - 10.3 mg/dL   GFR calc non Af Amer >60 >60 mL/min   GFR calc Af Amer >60 >60 mL/min    Comment: (NOTE) The eGFR has been calculated using the CKD EPI equation. This calculation has not been validated in all clinical situations. eGFR's persistently <60 mL/min signify possible Chronic Kidney Disease.    Anion gap 6 5 - 15    Comment: Performed at [facility], [place] [address] [place], [place] [address]    Ct Abdomen Pelvis W Contrast  Result Date: 11/01/2017 CLINICAL DATA:  Low abdominal and pelvic pressure. Nausea. Diverticulitis with pelvic abscess. EXAM: CT ABDOMEN AND PELVIS WITH CONTRAST TECHNIQUE: Multidetector CT imaging of the abdomen and pelvis was performed using the standard protocol following bolus administration of intravenous contrast. CONTRAST:  13m ISOVUE-300 IOPAMIDOL (ISOVUE-300) INJECTION 61% COMPARISON:  10/29/2017 FINDINGS: Lower chest: Chronic atelectasis or scarring noted at the lung bases. Hepatobiliary: Stable hepatic cyst in the dome of liver. Tiny hypoattenuating lesion inferior right liver is also stable. Gallbladder surgically absent. Extrahepatic biliary duct prominence with 8 mm diameter common bile duct is unchanged in the interval. Pancreas: No focal mass lesion. No dilatation of the main duct. No intraparenchymal cyst. No peripancreatic edema. Spleen: No splenomegaly. No  focal mass lesion. Adrenals/Urinary Tract: No adrenal nodule or mass. Kidneys unremarkable. No evidence for hydroureter. The urinary bladder appears normal for the degree of distention. Stomach/Bowel: Stomach is nondistended. No gastric wall  thickening. No evidence of outlet obstruction. Duodenum is normally positioned as is the ligament of Treitz. No small bowel wall thickening. No small bowel dilatation. The terminal ileum is normal. The appendix is not visualized, but there is no edema or inflammation in the region of the cecum. Colon is nondilated. Diverticular changes are noted in the left colon. Wall thickening with pericolonic edema/inflammation again noted in the proximal sigmoid segment. The extraluminal collection of gas and fluid in the left lower quadrant persists and measures 2.6 cm today compared to 1.9 cm previously. This collection is contiguous with a loop of small bowel the demonstrates wall thickening, presumably secondary. Vascular/Lymphatic: There is abdominal aortic atherosclerosis without aneurysm. There is no gastrohepatic or hepatoduodenal ligament lymphadenopathy. No intraperitoneal or retroperitoneal lymphadenopathy. No pelvic sidewall lymphadenopathy. Reproductive: Uterus is surgically absent. There is no adnexal mass. Other: Trace free fluid is seen in the cul-de-sac with edema/inflammation in the sigmoid mesocolon and lower pelvic mesentery. Musculoskeletal: Degenerative changes noted in the symphysis pubis. Bones are diffusely demineralized. No worrisome lytic or sclerotic osseous abnormality. IMPRESSION: 1. Similar appearance of edema/inflammation in the pelvis with a left-sided predominance. Extraluminal collection of gas and fluid in the left pelvis is slightly larger today. Imaging features remain most suggestive of perforated diverticulitis with para colonic abscess. Perforation of pelvic small bowel is a possibility but considered less likely. 2.  Aortic Atherosclerois (ICD10-170.0) Electronically Signed   By: Misty Stanley M.D.   On: 11/01/2017 14:55    Discharge Exam: Blood pressure 117/70, pulse (!) 59, temperature 98.6 F (37 C), temperature source Oral, resp. rate 16, height 5' 5"  (1.651 m), weight 59.9 kg  (132 lb), SpO2 97 %.  General: Pt awake/alert/oriented x4 in No acute distress.  Smiling.  Chatting.  Relaxed.  In good spirits Eyes: PERRL, normal EOM.  Sclera clear.  No icterus Neuro: CN II-XII intact w/o focal sensory/motor deficits. Lymph: No head/neck/groin lymphadenopathy Psych:  No delerium/psychosis/paranoia HENT: Normocephalic, Mucus membranes moist.  No thrush Neck: Supple, No tracheal deviation Chest: No chest wall pain w good excursion CV:  Pulses intact.  Regular rhythm MS: Normal AROM mjr joints.  No obvious deformity Abdomen: Soft.  Nondistended.  Nontender.  No evidence of peritonitis.  No incarcerated hernias. Ext:  SCDs BLE.  No mjr edema.  No cyanosis Skin: No petechiae / purpura  Past Medical History:  Diagnosis Date  . Anxiety   . 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 TMJ disorder involving articular disc abnormality     Past Surgical History:  Procedure Laterality Date  . ABDOMINAL HYSTERECTOMY    . APPENDECTOMY    . CHOLECYSTECTOMY    . CYST EXCISION Right 10/06/2015   Procedure: RIGHT LONG FINGER EXCISION MUCOID CYST;  Surgeon: Leanora Cover, MD;  Location: [facility];  Service: Orthopedics;  Laterality: Right;  Bier block  . KNEE ARTHROSCOPY    . OOPHORECTOMY    . SHOULDER ARTHROSCOPY    . TONSILLECTOMY      Social History   Socioeconomic History  . Marital status: Widowed    Spouse name: Not on file  . Number of children: Not on file  . Years of education: Not on file  . Highest education level: Not on file  Occupational History  .  Not on file  Social Needs  . Financial resource strain: Not on file  . Food insecurity:    Worry: Not on file    Inability: Not on file  . Transportation needs:    Medical: Not on file    Non-medical: Not on file  Tobacco Use  . Smoking status: Never Smoker  . Smokeless tobacco: Never Used  Substance and Sexual Activity  . Alcohol use: Yes    Comment: occas  . Drug use: No  . Sexual  activity: Never  Lifestyle  . Physical activity:    Days per week: Not on file    Minutes per session: Not on file  . Stress: Not on file  Relationships  . Social connections:    Talks on phone: Not on file    Gets together: Not on file    Attends religious service: Not on file    Active member of club or organization: Not on file    Attends meetings of clubs or organizations: Not on file    Relationship status: Not on file  . Intimate partner violence:    Fear of current or ex partner: Not on file    Emotionally abused: Not on file    Physically abused: Not on file    Forced sexual activity: Not on file  Other Topics Concern  . Not on file  Social History Narrative  . Not on file    Family History  Problem Relation Age of Onset  . Alcohol abuse Mother   . Bipolar disorder Mother   . Alcohol abuse Maternal Uncle   . Drug abuse Cousin   . OCD Other     Current Facility-Administered Medications  Medication Dose Route Frequency Provider Last Rate Last Dose  . 0.9 %  sodium chloride infusion  250 mL Intravenous PRN Michael Boston, MD      . acetaminophen (TYLENOL) tablet 1,000 mg  1,000 mg Oral TID Michael Boston, MD   1,000 mg at 11/04/17 2216  . ALPRAZolam Duanne Moron) tablet 0.25 mg  0.25 mg Oral QHS PRN Romana Juniper A, MD   0.25 mg at 11/04/17 2216  . alum & mag hydroxide-simeth (MAALOX/MYLANTA) 200-200-20 MG/5ML suspension 30 mL  30 mL Oral Q6H PRN Michael Boston, MD      . dicyclomine (BENTYL) tablet 20 mg  20 mg Oral TID Rogelia Mire, MD   20 mg at 11/04/17 1605  . diphenhydrAMINE (BENADRYL) 12.5 MG/5ML elixir 12.5 mg  12.5 mg Oral Q6H PRN Romana Juniper A, MD   12.5 mg at 11/04/17 2217   Or  . diphenhydrAMINE (BENADRYL) injection 12.5 mg  12.5 mg Intravenous Q6H PRN Romana Juniper A, MD      . enoxaparin (LOVENOX) injection 40 mg  40 mg Subcutaneous QHS Romana Juniper A, MD   40 mg at 11/03/17 2214  . feeding supplement (ENSURE SURGERY) liquid 237 mL  237 mL Oral  BID BM Michael Boston, MD   237 mL at 11/04/17 1343  . guaiFENesin-dextromethorphan (ROBITUSSIN DM) 100-10 MG/5ML syrup 10 mL  10 mL Oral Q4H PRN Michael Boston, MD      . hydrALAZINE (APRESOLINE) injection 10 mg  10 mg Intravenous Q2H PRN Romana Juniper A, MD      . hydrocortisone (ANUSOL-HC) 2.5 % rectal cream 1 application  1 application Topical QID PRN Michael Boston, MD      . hydrocortisone cream 1 % 1 application  1 application Topical TID PRN Michael Boston, MD      .  HYDROmorphone (DILAUDID) injection 0.5-1 mg  0.5-1 mg Intravenous Q2H PRN Michael Boston, MD      . lactated ringers bolus 1,000 mL  1,000 mL Intravenous Q8H PRN Branna Cortina, Remo Lipps, MD      . lip balm (CARMEX) ointment 1 application  1 application Topical BID Michael Boston, MD   1 application at 58/85/02 2221  . magic mouthwash  15 mL Oral QID PRN Michael Boston, MD      . magnesium oxide (MAG-OX) tablet 200 mg  200 mg Oral Daily Michael Boston, MD   200 mg at 11/04/17 1100  . menthol-cetylpyridinium (CEPACOL) lozenge 3 mg  1 lozenge Oral PRN Michael Boston, MD      . methocarbamol (ROBAXIN) tablet 500 mg  500 mg Oral Q6H PRN Romana Juniper A, MD      . metoprolol tartrate (LOPRESSOR) injection 5 mg  5 mg Intravenous Q6H PRN Romana Juniper A, MD      . ondansetron (ZOFRAN-ODT) disintegrating tablet 4 mg  4 mg Oral Q6H PRN Clovis Riley, MD       Or  . ondansetron [facility]) injection 4 mg  4 mg Intravenous Q6H PRN Clovis Riley, MD   4 mg at 11/02/17 0847  . phenol (CHLORASEPTIC) mouth spray 1-2 spray  1-2 spray Mouth/Throat PRN Michael Boston, MD      . piperacillin-tazobactam (ZOSYN) IVPB 3.375 g  3.375 g Intravenous Q8H Clovis Riley, MD   Stopped at 11/05/17 [phone]  . polyethylene glycol (MIRALAX / GLYCOLAX) packet 17 g  17 g Oral Daily Michael Boston, MD   17 g at 11/04/17 1100  . prochlorperazine (COMPAZINE) injection 5-10 mg  5-10 mg Intravenous Q4H PRN Michael Boston, MD      . saccharomyces boulardii (FLORASTOR) capsule  250 mg  250 mg Oral BID Michael Boston, MD   250 mg at 11/04/17 2216  . simethicone (MYLICON) chewable tablet 40 mg  40 mg Oral Q6H PRN Romana Juniper A, MD      . sodium chloride flush (NS) 0.9 % injection 3 mL  3 mL Intravenous Gorden Harms, MD   3 mL at 11/04/17 2222  . sodium chloride flush (NS) 0.9 % injection 3 mL  3 mL Intravenous PRN Michael Boston, MD      . traMADol Veatrice Bourbon) tablet 50 mg  50 mg Oral Q6H PRN Clovis Riley, MD   50 mg at 11/01/17 0910  . vitamin B-12 (CYANOCOBALAMIN) tablet 500 mcg  500 mcg Oral Daily Michael Boston, MD   500 mcg at 11/04/17 1100     Allergies  Allergen Reactions  . Sulfa Antibiotics Anaphylaxis  . Erythromycin   . Nsaids Nausea And Vomiting  . Sucralfate Nausea Only  . Sudafed [Pseudoephedrine Hcl] Other (See Comments)    dizziness  . Voltaren [Diclofenac Sodium] Other (See Comments)    gastrointestinal     Signed: Morton Peters, MD, FACS, MASCRS Gastrointestinal and Minim[facility] Invasive Surgery    [address] [place], [place] [address] [phone] Main / Paging [phone] Fax   11/05/2017, 8:05 AM

## 2017-11-05 NOTE — Progress Notes (Signed)
Pt ambulating in hall without difficulty without assistance.

## 2017-12-19 ENCOUNTER — Encounter (HOSPITAL_COMMUNITY): Payer: Self-pay | Admitting: Psychiatry

## 2017-12-19 ENCOUNTER — Ambulatory Visit (INDEPENDENT_AMBULATORY_CARE_PROVIDER_SITE_OTHER): Payer: MEDICARE | Admitting: Psychiatry

## 2017-12-19 VITALS — BP 126/72 | HR 80 | Ht 66.0 in | Wt 129.0 lb

## 2017-12-19 DIAGNOSIS — F411 Generalized anxiety disorder: Secondary | ICD-10-CM

## 2017-12-19 DIAGNOSIS — F5101 Primary insomnia: Secondary | ICD-10-CM

## 2017-12-19 MED ORDER — ALPRAZOLAM 0.5 MG PO TABS: ORAL_TABLET | ORAL | 0 refills | Status: DC

## 2017-12-19 MED ORDER — ALPRAZOLAM 0.25 MG PO TABS: 0.2500 mg | ORAL_TABLET | Freq: Every day | ORAL | 2 refills | Status: AC

## 2017-12-19 MED ORDER — ESCITALOPRAM OXALATE 5 MG PO TABS: 5.0000 mg | ORAL_TABLET | Freq: Every day | ORAL | 0 refills | Status: DC

## 2017-12-19 NOTE — Progress Notes (Signed)
[facility] MD/PA/NP OP Progress Note  12/19/2017 10:47 AM Alexandria Chapman  MRN:  [id number]  Chief Complaint: med management  HPI: Alexandria Chapman reports she is sleeping okay with the Xanax.  She has had some flare in her anxiety and depression and wonders about restarting Lexapro.  Denies any safety concerns and is agreeable to a therapy referral.  No other acute concerns at this time and we will follow-up in 3 months.  Visit Diagnosis:    ICD-10-CM   1. GAD (generalized anxiety disorder) F41.1 escitalopram (LEXAPRO) 5 MG tablet  2. Primary insomnia F51.01 ALPRAZolam (XANAX) 0.25 MG tablet    Past Psychiatric History: See intake H&P for full details. Reviewed, with no updates at this time.  Past Medical History:  Past Medical History:  Diagnosis Date  . Anxiety   . 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 TMJ disorder involving articular disc abnormality     Past Surgical History:  Procedure Laterality Date  . ABDOMINAL HYSTERECTOMY    . APPENDECTOMY    . CHOLECYSTECTOMY    . CYST EXCISION Right 10/06/2015   Procedure: RIGHT LONG FINGER EXCISION MUCOID CYST;  Surgeon: Leanora Cover, MD;  Location: [facility];  Service: Orthopedics;  Laterality: Right;  Bier block  . KNEE ARTHROSCOPY    . OOPHORECTOMY    . SHOULDER ARTHROSCOPY    . TONSILLECTOMY      Family Psychiatric History: See intake H&P for full details. Reviewed, with no updates at this time.   Family History:  Family History  Problem Relation Age of Onset  . Alcohol abuse Mother   . Bipolar disorder Mother   . Alcohol abuse Maternal Uncle   . Drug abuse Cousin   . OCD Other     Social History:  Social History   Socioeconomic History  . Marital status: Widowed    Spouse name: Not on file  . Number of children: Not on file  . Years of education: Not on file  . Highest education level: Not on file  Occupational History  . Not on file  Social Needs  . Financial resource strain: Not on file  .  Food insecurity:    Worry: Not on file    Inability: Not on file  . Transportation needs:    Medical: Not on file    Non-medical: Not on file  Tobacco Use  . Smoking status: Never Smoker  . Smokeless tobacco: Never Used  Substance and Sexual Activity  . Alcohol use: Yes    Comment: occas  . Drug use: No  . Sexual activity: Never  Lifestyle  . Physical activity:    Days per week: Not on file    Minutes per session: Not on file  . Stress: Not on file  Relationships  . Social connections:    Talks on phone: Not on file    Gets together: Not on file    Attends religious service: Not on file    Active member of club or organization: Not on file    Attends meetings of clubs or organizations: Not on file    Relationship status: Not on file  Other Topics Concern  . Not on file  Social History Narrative  . Not on file    Allergies:  Allergies  Allergen Reactions  . Sulfa Antibiotics Anaphylaxis  . Erythromycin   . Nsaids Nausea And Vomiting  . Sucralfate Nausea Only  . Sudafed [Pseudoephedrine Hcl] Other (See Comments)  dizziness  . Voltaren [Diclofenac Sodium] Other (See Comments)    gastrointestinal     Metabolic Disorder Labs: No results found for: HGBA1C, MPG No results found for: PROLACTIN No results found for: CHOL, TRIG, HDL, CHOLHDL, VLDL, LDLCALC No results found for: TSH  Therapeutic Level Labs: No results found for: LITHIUM No results found for: VALPROATE No components found for:  CBMZ  Current Medications: Current Outpatient Medications  Medication Sig Dispense Refill  . ALOE VERA JUICE LIQD Take 2 oz by mouth.    . ALPRAZolam (XANAX) 0.25 MG tablet Take 1 tablet (0.25 mg total) by mouth at bedtime. 30 tablet 2  . ALPRAZolam (XANAX) 0.5 MG tablet Use for anxiety or panic in the daytime if needed, up to twice a week 15 tablet 0  . amoxicillin-clavulanate (AUGMENTIN) 875-125 MG tablet Take 1 tablet by mouth 2 (two) times daily. 10 tablet 1  . Biotin  5 MG CAPS Take by mouth.    . Calcium-Magnesium-Vitamin D (CALCIUM 500 PO) Take 500 mg by mouth.    . Calcium-Magnesium-Zinc 500-250-12.5 MG TABS Take 1 tablet by mouth 3 (three) times daily.    . cholecalciferol (VITAMIN D) 1000 units tablet Take 1,000 Units by mouth daily.    Marland Kitchen co-enzyme Q-10 50 MG capsule Take 50 mg by mouth daily.    . COD LIVER OIL PO Take 228 mg by mouth daily.     . cyanocobalamin 500 MCG tablet Take 500 mcg by mouth daily.    Marland Kitchen dicyclomine (BENTYL) 20 MG tablet Take 20 mg by mouth 2 (two) times daily as needed for spasms.     Marland Kitchen escitalopram (LEXAPRO) 5 MG tablet Take 1 tablet (5 mg total) by mouth daily. 90 tablet 0  . Magnesium 250 MG TABS Take 250 mg by mouth.    . OMEGA 3-6-9 FATTY ACIDS PO Take 1 capsule by mouth daily.     . pantoprazole (PROTONIX) 40 MG tablet Take 40 mg by mouth daily.    . polyethylene glycol (MIRALAX / GLYCOLAX) packet Take 17 g by mouth 2 (two) times daily. 14 each 0  . Probiotic Product (ALIGN PO) Take 1 capsule by mouth daily.     . Selenium 200 MCG CAPS Take 200 mcg by mouth daily.      No current facility-administered medications for this visit.    Musculoskeletal: Strength & Muscle Tone: within normal limits Gait & Station: normal Patient leans: N/A  Psychiatric Specialty Exam: ROS  Blood pressure 126/72, pulse 80, height 5\' 6"  (1.676 m), weight 129 lb (58.5 kg).Body mass index is 20.82 kg/m.  General Appearance: Casual and Well Groomed  Eye Contact:  Good  Speech:  Clear and Coherent and Normal Rate  Volume:  Normal  Mood:  Anxious  Affect:  Appropriate and Congruent  Thought Process:  Goal Directed and Descriptions of Associations: Intact  Orientation:  Full (Time, Place, and Person)  Thought Content: Logical   Suicidal Thoughts:  No  Homicidal Thoughts:  No  Memory:  Immediate;   Fair  Judgement:  Fair  Insight:  Fair  Psychomotor Activity:  Normal  Concentration:  Concentration: Good  Recall:  Good  Fund of  Knowledge: Good  Language: Good  Akathisia:  Negative  Handed:  Right  AIMS (if indicated): not done  Assets:  Communication Skills Desire for Improvement Financial Resources/Insurance Housing  ADL's:  Intact  Cognition: WNL  Sleep:  Good   Screenings:    Assessment and Plan: Tiajuana Amass  has had good results with a low-dose of Xanax nightly for sleep.  Has had a flare in depression and anxiety symptoms recently and tends to do well with a low-dose of Lexapro.  We will proceed as below and follow-up in 3 months.  1. GAD (generalized anxiety disorder)   2. Primary insomnia     Status of current problems: stable  Labs Ordered: No orders of the defined types were placed in this encounter.   Labs Reviewed: n/a  Collateral Obtained/Records Reviewed: n/a  Plan:  Continue xanax 0.25 mg nightly Xanax 0.5 mg tablet for acute anxiety/panic - 15 tablets sent lexapro 5 mg daily rtc 3 months   I spent 20 minutes with the patient in direct face-to-face clinical care.  Greater than 50% of this time was spent in counseling and coordination of care with the patient.    Aundra Dubin, MD 12/19/2017, 10:47 AM

## 2017-12-19 NOTE — Patient Instructions (Addendum)
[email or web address] [address] [place], [place] [address] Phone - [phone] Fax [phone]   Reading assignment in lieu of therapy since you will be down/out after surgery for 3 months Becoming Attached

## 2017-12-28 NOTE — Progress Notes (Signed)
Need orders for 9-27 surgery in epic pre op is 9-19

## 2018-01-01 NOTE — H&P (Addendum)
TOTAL KNEE ADMISSION H&P  Patient is being admitted for right total knee arthroplasty.  Subjective:  Chief Complaint:right knee pain.  HPI: Alexandria Chapman, 72 y.o. female, has a history of pain and functional disability in the right knee due to arthritis and has failed non-surgical conservative treatments for greater than 12 weeks to includecorticosteriod injections, viscosupplementation injections, flexibility and strengthening excercises, use of assistive devices and activity modification.  Onset of symptoms was gradual, starting 2 years ago with gradually worsening course since that time. The patient noted prior procedures on the knee to include  arthroscopy on the right knee(s).  Patient currently rates pain in the right knee(s) at 7 out of 10 with activity. Patient has worsening of pain with activity and weight bearing, pain that interferes with activities of daily living and pain with passive range of motion.  Patient has evidence of periarticular osteophytes and joint space narrowing by imaging studies. There is no active infection.  Patient Active Problem List   Diagnosis Date Noted  . Insomnia 11/04/2017  . Anxiety   . Colonic diverticular abscess 11/01/2017  . Diverticulitis 10/29/2017  . Impingement syndrome of right shoulder region 06/16/2017  . Irritable bowel syndrome with constipation 02/15/2017  . Gastroesophageal reflux disease 11/10/2016  . Diverticulosis of large intestine without hemorrhage 10/14/2015  . 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 Generalized anxiety disorder 05/08/2015  . 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 Anxiety   . 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 TMJ disorder involving articular disc abnormality     Past Surgical History:  Procedure Laterality Date  . ABDOMINAL HYSTERECTOMY    . APPENDECTOMY    . CHOLECYSTECTOMY    . CYST EXCISION Right 10/06/2015   Procedure: RIGHT LONG FINGER EXCISION MUCOID  CYST;  Surgeon: Leanora Cover, MD;  Location: [facility];  Service: Orthopedics;  Laterality: Right;  Bier block  . KNEE ARTHROSCOPY    . OOPHORECTOMY    . SHOULDER ARTHROSCOPY    . TONSILLECTOMY      No current facility-administered medications for this encounter.    Current Outpatient Medications  Medication Sig Dispense Refill Last Dose  . ALOE VERA JUICE LIQD Take 2 oz by mouth.   Not Taking at Unknown time  . ALPRAZolam (XANAX) 0.25 MG tablet Take 1 tablet (0.25 mg total) by mouth at bedtime. 30 tablet 2   . ALPRAZolam (XANAX) 0.5 MG tablet Use for anxiety or panic in the daytime if needed, up to twice a week 15 tablet 0   . amoxicillin-clavulanate (AUGMENTIN) 875-125 MG tablet Take 1 tablet by mouth 2 (two) times daily. 10 tablet 1   . Biotin 5 MG CAPS Take by mouth.   Not Taking at Unknown time  . Calcium-Magnesium-Vitamin D (CALCIUM 500 PO) Take 500 mg by mouth.   Not Taking at Unknown time  . Calcium-Magnesium-Zinc 500-250-12.5 MG TABS Take 1 tablet by mouth 3 (three) times daily.   10/29/2017 at Unknown time  . cholecalciferol (VITAMIN D) 1000 units tablet Take 1,000 Units by mouth daily.   Past Week at Unknown time  . co-enzyme Q-10 50 MG capsule Take 50 mg by mouth daily.   10/29/2017 at Unknown time  . COD LIVER OIL PO Take 228 mg by mouth daily.    10/29/2017 at Unknown time  . cyanocobalamin 500 MCG tablet Take 500 mcg by mouth daily.   Not Taking at Unknown time  .  dicyclomine (BENTYL) 20 MG tablet Take 20 mg by mouth 2 (two) times daily as needed for spasms.    10/29/2017 at Unknown time  . escitalopram (LEXAPRO) 5 MG tablet Take 1 tablet (5 mg total) by mouth daily. 90 tablet 0   . Magnesium 250 MG TABS Take 250 mg by mouth.   Not Taking at Unknown time  . OMEGA 3-6-9 FATTY ACIDS PO Take 1 capsule by mouth daily.    10/29/2017 at Unknown time  . pantoprazole (PROTONIX) 40 MG tablet Take 40 mg by mouth daily.   Not Taking at Unknown time  . polyethylene glycol (MIRALAX /  GLYCOLAX) packet Take 17 g by mouth 2 (two) times daily. 14 each 0   . Probiotic Product (ALIGN PO) Take 1 capsule by mouth daily.    10/29/2017 at Unknown time  . Selenium 200 MCG CAPS Take 200 mcg by mouth daily.    10/29/2017 at Unknown time   Allergies  Allergen Reactions  . Sulfa Antibiotics Anaphylaxis  . Erythromycin   . Nsaids Nausea And Vomiting  . Sucralfate Nausea Only  . Sudafed [Pseudoephedrine Hcl] Other (See Comments)    dizziness  . Voltaren [Diclofenac Sodium] Other (See Comments)    gastrointestinal     Social History   Tobacco Use  . Smoking status: Never Smoker  . Smokeless tobacco: Never Used  Substance Use Topics  . Alcohol use: Yes    Comment: occas    Family History  Problem Relation Age of Onset  . Alcohol abuse Mother   . Bipolar disorder Mother   . Alcohol abuse Maternal Uncle   . Drug abuse Cousin   . OCD Other      Review of Systems  Constitutional: Negative.   HENT: Negative.   Eyes: Negative.   Respiratory: Negative.   Cardiovascular: Negative.   Gastrointestinal: Negative.   Genitourinary: Negative.   Musculoskeletal: Positive for joint pain.  Skin: Negative.   Neurological: Negative.   Endo/Heme/Allergies: Negative.   Psychiatric/Behavioral: Negative.     Objective:  Physical Exam  Constitutional: She is oriented to person, place, and time. She appears well-developed.  HENT:  Head: Normocephalic.  Eyes: EOM are normal.  Neck: Normal range of motion.  Cardiovascular: Normal rate and intact distal pulses.  Respiratory: Effort normal and breath sounds normal.  GI: Soft.  Genitourinary:  Genitourinary Comments: Deferred  Musculoskeletal:  Knee crepitus. Limited ROM and Strength. Knee I stable at varus and valgus stress.   Neurological: She is alert and oriented to person, place, and time.  Skin: Skin is warm and dry.  Psychiatric: Her behavior is normal.    Vital signs in last 24 hours: BP: ()/()  Arterial Line BP: ()/()    Labs:   Estimated body mass index is 20.82 kg/m as calculated from the following:   Height as of 12/19/17: 5\' 6"  (1.676 m).   Weight as of 12/19/17: 58.5 kg.   Imaging Review Plain radiographs demonstrate moderate degenerative joint disease of the right knee(s). The overall alignment ismild varus. The bone quality appears to be good for age and reported activity level.   Preoperative templating of the joint replacement has been completed, documented, and submitted to the Operating Room personnel in order to optimize intra-operative equipment management.   Anticipated LOS equal to or greater than 2 midnights due to - Age 54 and older with one or more of the following:  - Obesity  - Expected need for hospital services (PT, OT, Nursing)  required for safe  discharge  - Anticipated need for postoperative skilled nursing care or inpatient rehab  - Active co-morbidities: None OR   - Unanticipated findings during/Post Surgery: None  - Patient is a high risk of re-admission due to: None     Assessment/Plan:  End stage arthritis, right knee   The patient history, physical examination, clinical judgment of the provider and imaging studies are consistent with end stage degenerative joint disease of the right knee(s) and total knee arthroplasty is deemed medically necessary. The treatment options including medical management, injection therapy arthroscopy and arthroplasty were discussed at length. The risks and benefits of total knee arthroplasty were presented and reviewed. The risks due to aseptic loosening, infection, stiffness, patella tracking problems, thromboembolic complications and other imponderables were discussed. The patient acknowledged the explanation, agreed to proceed with the plan and consent was signed. Patient is being admitted for inpatient treatment for surgery, pain control, PT, OT, prophylactic antibiotics, VTE prophylaxis, progressive ambulation and ADL's and discharge  planning. The patient is planning to be discharged home and OP PY start at [facility] on 01/23/18.  Will use IV tranexamic acid. Contraindications and adverse affects of Tranexamic acid discussed in detail. Patient denies any of these at this time and understands the risks and benefits.

## 2018-01-03 ENCOUNTER — Ambulatory Visit: Payer: Self-pay | Admitting: Orthopedic Surgery

## 2018-01-10 NOTE — Progress Notes (Signed)
12-20-17 Surgical clearance and EKG on chart.

## 2018-01-10 NOTE — Patient Instructions (Signed)
Alexandria Chapman  01/10/2018   Your procedure is scheduled on: 01-19-18   Report to [facility] Main  Entrance    Report to Admitting at 7:15  AM    Call this number if you have problems the morning of surgery [phone]   Remember: Do not eat food or drink liquids :After Midnight.    Take these medicines the morning of surgery with A SIP OF WATER: Alprazolam as needed.    BRUSH YOUR TEETH MORNING OF SURGERY AND RINSE YOUR MOUTH OUT, NO CHEWING GUM CANDY OR MINTS.                                You may not have any metal on your body including hair pins and              piercings  Do not wear jewelry, make-up, lotions, powders or perfumes, deodorant             Do not wear nail polish.  Do not shave  48 hours prior to surgery.                Do not bring valuables to the hospital. [place].  Contacts, dentures or bridgework may not be worn into surgery.  Leave suitcase in the car. After surgery it may be brought to your room.    Special Instructions: N/A              Please read over the following fact sheets you were given: _____________________________________________________________________          [facility] - Preparing for Surgery Before surgery, you can play an important role.  Because skin is not sterile, your skin needs to be as free of germs as possible.  You can reduce the number of germs on your skin by washing with CHG (chlorahexidine gluconate) soap before surgery.  CHG is an antiseptic cleaner which kills germs and bonds with the skin to continue killing germs even after washing. Please DO NOT use if you have an allergy to CHG or antibacterial soaps.  If your skin becomes reddened/irritated stop using the CHG and inform your nurse when you arrive at Short Stay. Do not shave (including legs and underarms) for at least 48 hours prior to the first CHG shower.  You may shave your  face/neck. Please follow these instructions carefully:  1.  Shower with CHG Soap the night before surgery and the  morning of Surgery.  2.  If you choose to wash your hair, wash your hair first as usual with your  normal  shampoo.  3.  After you shampoo, rinse your hair and body thoroughly to remove the  shampoo.                           4.  Use CHG as you would any other liquid soap.  You can apply chg directly  to the skin and wash                       Gently with a scrungie or clean washcloth.  5.  Apply the CHG Soap to your body ONLY FROM THE NECK DOWN.  Do not use on face/ open                           Wound or open sores. Avoid contact with eyes, ears mouth and genitals (private parts).                       Wash face,  Genitals (private parts) with your normal soap.             6.  Wash thoroughly, paying special attention to the area where your surgery  will be performed.  7.  Thoroughly rinse your body with warm water from the neck down.  8.  DO NOT shower/wash with your normal soap after using and rinsing off  the CHG Soap.                9.  Pat yourself dry with a clean towel.            10.  Wear clean pajamas.            11.  Place clean sheets on your bed the night of your first shower and do not  sleep with pets. Day of Surgery : Do not apply any lotions/deodorants the morning of surgery.  Please wear clean clothes to the hospital/surgery center.  FAILURE TO FOLLOW THESE INSTRUCTIONS MAY RESULT IN THE CANCELLATION OF YOUR SURGERY PATIENT SIGNATURE_________________________________  NURSE SIGNATURE__________________________________  ________________________________________________________________________   Alexandria Chapman  An incentive spirometer is a tool that can help keep your lungs clear and active. This tool measures how well you are filling your lungs with each breath. Taking long deep breaths may help reverse or decrease the chance of developing breathing  (pulmonary) problems (especially infection) following:  A long period of time when you are unable to move or be active. BEFORE THE PROCEDURE   If the spirometer includes an indicator to show your best effort, your nurse or respiratory therapist will set it to a desired goal.  If possible, sit up straight or lean slightly forward. Try not to slouch.  Hold the incentive spirometer in an upright position. INSTRUCTIONS FOR USE  1. Sit on the edge of your bed if possible, or sit up as far as you can in bed or on a chair. 2. Hold the incentive spirometer in an upright position. 3. Breathe out normally. 4. Place the mouthpiece in your mouth and seal your lips tightly around it. 5. Breathe in slowly and as deeply as possible, raising the piston or the ball toward the top of the column. 6. Hold your breath for 3-5 seconds or for as long as possible. Allow the piston or ball to fall to the bottom of the column. 7. Remove the mouthpiece from your mouth and breathe out normally. 8. Rest for a few seconds and repeat Steps 1 through 7 at least 10 times every 1-2 hours when you are awake. Take your time and take a few normal breaths between deep breaths. 9. The spirometer may include an indicator to show your best effort. Use the indicator as a goal to work toward during each repetition. 10. After each set of 10 deep breaths, practice coughing to be sure your lungs are clear. If you have an incision (the cut made at the time of surgery), support your incision when coughing by placing a pillow or rolled up towels firmly against it. Once you are able to get out of  bed, walk around indoors and cough well. You may stop using the incentive spirometer when instructed by your caregiver.  RISKS AND COMPLICATIONS  Take your time so you do not get dizzy or light-headed.  If you are in pain, you may need to take or ask for pain medication before doing incentive spirometry. It is harder to take a deep breath if you  are having pain. AFTER USE  Rest and breathe slowly and easily.  It can be helpful to keep track of a log of your progress. Your caregiver can provide you with a simple table to help with this. If you are using the spirometer at home, follow these instructions: [place] IF:   You are having difficultly using the spirometer.  You have trouble using the spirometer as often as instructed.  Your pain medication is not giving enough relief while using the spirometer.  You develop fever of 100.5 F (38.1 C) or higher. SEEK IMMEDIATE MEDICAL CARE IF:   You cough up bloody sputum that had not been present before.  You develop fever of 102 F (38.9 C) or greater.  You develop worsening pain at or near the incision site. MAKE SURE YOU:   Understand these instructions.  Will watch your condition.  Will get help right away if you are not doing well or get worse. Document Released: 08/22/2006 Document Revised: 07/04/2011 Document Reviewed: 10/23/2006 Exit[employer] Patient Information 2014 Exit[employer], [place].   ________________________________________________________________________  WHAT IS A BLOOD TRANSFUSION? Blood Transfusion Information  A transfusion is the replacement of blood or some of its parts. Blood is made up of multiple cells which provide different functions.  Red blood cells carry oxygen and are used for blood loss replacement.  White blood cells fight against infection.  Platelets control bleeding.  Plasma helps clot blood.  Other blood products are available for specialized needs, such as hemophilia or other clotting disorders. BEFORE THE TRANSFUSION  Who gives blood for transfusions?   Healthy volunteers who are fully evaluated to make sure their blood is safe. This is blood bank blood. Transfusion therapy is the safest it has ever been in the practice of medicine. Before blood is taken from a donor, a complete history is taken to make sure that person has  no history of diseases nor engages in risky social behavior (examples are intravenous drug use or sexual activity with multiple partners). The donor's travel history is screened to minimize risk of transmitting infections, such as malaria. The donated blood is tested for signs of infectious diseases, such as HIV and hepatitis. The blood is then tested to be sure it is compatible with you in order to minimize the chance of a transfusion reaction. If you or a relative donates blood, this is often done in anticipation of surgery and is not appropriate for emergency situations. It takes many days to process the donated blood. RISKS AND COMPLICATIONS Although transfusion therapy is very safe and saves many lives, the main dangers of transfusion include:   Getting an infectious disease.  Developing a transfusion reaction. This is an allergic reaction to something in the blood you were given. Every precaution is taken to prevent this. The decision to have a blood transfusion has been considered carefully by your caregiver before blood is given. Blood is not given unless the benefits outweigh the risks. AFTER THE TRANSFUSION  Right after receiving a blood transfusion, you will usually feel much better and more energetic. This is especially true if your red blood  cells have gotten low (anemic). The transfusion raises the level of the red blood cells which carry oxygen, and this usually causes an energy increase.  The nurse administering the transfusion will monitor you carefully for complications. HOME CARE INSTRUCTIONS  No special instructions are needed after a transfusion. You may find your energy is better. Speak with your caregiver about any limitations on activity for underlying diseases you may have. SEEK MEDICAL CARE IF:   Your condition is not improving after your transfusion.  You develop redness or irritation at the intravenous (IV) site. SEEK IMMEDIATE MEDICAL CARE IF:  Any of the following  symptoms occur over the next 12 hours:  Shaking chills.  You have a temperature by mouth above 102 F (38.9 C), not controlled by medicine.  Chest, back, or muscle pain.  People around you feel you are not acting correctly or are confused.  Shortness of breath or difficulty breathing.  Dizziness and fainting.  You get a rash or develop hives.  You have a decrease in urine output.  Your urine turns a dark color or changes to pink, red, or brown. Any of the following symptoms occur over the next 10 days:  You have a temperature by mouth above 102 F (38.9 C), not controlled by medicine.  Shortness of breath.  Weakness after normal activity.  The white part of the eye turns yellow (jaundice).  You have a decrease in the amount of urine or are urinating less often.  Your urine turns a dark color or changes to pink, red, or brown. Document Released: 04/08/2000 Document Revised: 07/04/2011 Document Reviewed: 11/26/2007 [facility] Patient Information 2014 [place], [place].  _______________________________________________________________________

## 2018-01-11 ENCOUNTER — Encounter (HOSPITAL_COMMUNITY)
Admission: RE | Admit: 2018-01-11 | Discharge: 2018-01-11 | Disposition: A | Discharge status: Home / Self Care | Payer: MEDICARE | Source: Ambulatory Visit | Attending: Specialist | Admitting: Specialist

## 2018-01-11 ENCOUNTER — Other Ambulatory Visit: Payer: Self-pay

## 2018-01-11 ENCOUNTER — Encounter (HOSPITAL_COMMUNITY): Payer: Self-pay

## 2018-01-11 DIAGNOSIS — Z01812 Encounter for preprocedural laboratory examination: Secondary | ICD-10-CM | POA: Diagnosis present

## 2018-01-11 LAB — BASIC METABOLIC PANEL
Anion gap: 8 (ref 5–15)
BUN: 14 mg/dL (ref 8–23)
CALCIUM: 9.4 mg/dL (ref 8.9–10.3)
CHLORIDE: 100 mmol/L (ref 98–111)
CO2: 28 mmol/L (ref 22–32)
CREATININE: 0.64 mg/dL (ref 0.44–1.00)
Glucose, Bld: 97 mg/dL (ref 70–99)
Potassium: 4.3 mmol/L (ref 3.5–5.1)
SODIUM: 136 mmol/L (ref 135–145)

## 2018-01-11 LAB — URINALYSIS, ROUTINE W REFLEX MICROSCOPIC
Bilirubin Urine: NEGATIVE
GLUCOSE, UA: NEGATIVE mg/dL
HGB URINE DIPSTICK: NEGATIVE
Ketones, ur: NEGATIVE mg/dL
Leukocytes, UA: NEGATIVE
Nitrite: NEGATIVE
PROTEIN: NEGATIVE mg/dL
Specific Gravity, Urine: 1.003 — ABNORMAL LOW (ref 1.005–1.030)
pH: 7 (ref 5.0–8.0)

## 2018-01-11 LAB — PROTIME-INR
INR: 0.91
PROTHROMBIN TIME: 12.2 s (ref 11.4–15.2)

## 2018-01-11 LAB — SURGICAL PCR SCREEN
MRSA, PCR: NEGATIVE
Staphylococcus aureus: NEGATIVE

## 2018-01-11 LAB — CBC
HCT: 39.2 % (ref 36.0–46.0)
Hemoglobin: 12.7 g/dL (ref 12.0–15.0)
MCH: 28.9 pg (ref 26.0–34.0)
MCHC: 32.4 g/dL (ref 30.0–36.0)
MCV: 89.1 fL (ref 78.0–100.0)
PLATELETS: 254 10*3/uL (ref 150–400)
RBC: 4.4 MIL/uL (ref 3.87–5.11)
RDW: 12.8 % (ref 11.5–15.5)
WBC: 5.6 10*3/uL (ref 4.0–10.5)

## 2018-01-11 LAB — APTT: APTT: 30 s (ref 24–36)

## 2018-01-11 LAB — ABO/RH: ABO/RH(D): O POS

## 2018-01-17 ENCOUNTER — Other Ambulatory Visit: Payer: Self-pay | Admitting: Specialist

## 2018-01-17 NOTE — Care Plan (Signed)
R TKA scheduled on 01-19-18 DCP:  Home with hired caregivers.  1 story home with 2 ste.  DME:  No needs.  Has a RW and 3-in-1. PT:  [facility].  PT eval scheduled on 01-23-18.

## 2018-01-19 ENCOUNTER — Inpatient Hospital Stay (HOSPITAL_COMMUNITY): Payer: MEDICARE | Admitting: Anesthesiology

## 2018-01-19 ENCOUNTER — Encounter (HOSPITAL_COMMUNITY): Payer: Self-pay | Admitting: *Deleted

## 2018-01-19 ENCOUNTER — Inpatient Hospital Stay (HOSPITAL_COMMUNITY)
Admission: RE | Admit: 2018-01-19 | Discharge: 2018-01-22 | DRG: 470 | Disposition: A | Discharge status: Home / Self Care | Payer: MEDICARE | Attending: Specialist | Admitting: Specialist

## 2018-01-19 ENCOUNTER — Encounter (HOSPITAL_COMMUNITY)
Admission: RE | Disposition: A | Discharge status: Home / Self Care | Payer: Self-pay | Source: Home / Self Care | Attending: Specialist

## 2018-01-19 ENCOUNTER — Other Ambulatory Visit: Payer: Self-pay

## 2018-01-19 DIAGNOSIS — F411 Generalized anxiety disorder: Secondary | ICD-10-CM | POA: Diagnosis present

## 2018-01-19 DIAGNOSIS — G4700 Insomnia, unspecified: Secondary | ICD-10-CM | POA: Diagnosis present

## 2018-01-19 DIAGNOSIS — E049 Nontoxic goiter, unspecified: Secondary | ICD-10-CM | POA: Diagnosis present

## 2018-01-19 DIAGNOSIS — Z882 Allergy status to sulfonamides status: Secondary | ICD-10-CM | POA: Diagnosis not present

## 2018-01-19 DIAGNOSIS — Z818 Family history of other mental and behavioral disorders: Secondary | ICD-10-CM

## 2018-01-19 DIAGNOSIS — Z888 Allergy status to other drugs, medicaments and biological substances status: Secondary | ICD-10-CM

## 2018-01-19 DIAGNOSIS — K219 Gastro-esophageal reflux disease without esophagitis: Secondary | ICD-10-CM | POA: Diagnosis present

## 2018-01-19 DIAGNOSIS — Z85828 Personal history of other malignant neoplasm of skin: Secondary | ICD-10-CM

## 2018-01-19 DIAGNOSIS — H905 Unspecified sensorineural hearing loss: Secondary | ICD-10-CM | POA: Diagnosis present

## 2018-01-19 DIAGNOSIS — R010 Benign and innocent cardiac murmurs: Secondary | ICD-10-CM | POA: Diagnosis present

## 2018-01-19 DIAGNOSIS — Z811 Family history of alcohol abuse and dependence: Secondary | ICD-10-CM | POA: Diagnosis not present

## 2018-01-19 DIAGNOSIS — R55 Syncope and collapse: Secondary | ICD-10-CM | POA: Diagnosis not present

## 2018-01-19 DIAGNOSIS — Z881 Allergy status to other antibiotic agents status: Secondary | ICD-10-CM | POA: Diagnosis not present

## 2018-01-19 DIAGNOSIS — M1711 Unilateral primary osteoarthritis, right knee: Principal | ICD-10-CM | POA: Diagnosis present

## 2018-01-19 DIAGNOSIS — M25761 Osteophyte, right knee: Secondary | ICD-10-CM | POA: Diagnosis present

## 2018-01-19 DIAGNOSIS — Z79899 Other long term (current) drug therapy: Secondary | ICD-10-CM | POA: Diagnosis not present

## 2018-01-19 DIAGNOSIS — Z9049 Acquired absence of other specified parts of digestive tract: Secondary | ICD-10-CM

## 2018-01-19 DIAGNOSIS — I340 Nonrheumatic mitral (valve) insufficiency: Secondary | ICD-10-CM | POA: Diagnosis not present

## 2018-01-19 DIAGNOSIS — Z90710 Acquired absence of both cervix and uterus: Secondary | ICD-10-CM | POA: Diagnosis not present

## 2018-01-19 DIAGNOSIS — Z96659 Presence of unspecified artificial knee joint: Secondary | ICD-10-CM

## 2018-01-19 DIAGNOSIS — R42 Dizziness and giddiness: Secondary | ICD-10-CM | POA: Diagnosis not present

## 2018-01-19 DIAGNOSIS — K581 Irritable bowel syndrome with constipation: Secondary | ICD-10-CM | POA: Diagnosis present

## 2018-01-19 HISTORY — PX: TOTAL KNEE ARTHROPLASTY: SHX125

## 2018-01-19 LAB — TYPE AND SCREEN
ABO/RH(D): O POS
ANTIBODY SCREEN: NEGATIVE

## 2018-01-19 SURGERY — ARTHROPLASTY, KNEE, TOTAL
Anesthesia: Spinal | Site: Knee | Laterality: Right

## 2018-01-19 MED ORDER — OXYCODONE HCL 5 MG PO TABS: 5.0000 mg | ORAL_TABLET | ORAL | 0 refills | Status: DC | PRN

## 2018-01-19 MED ORDER — STERILE WATER FOR IRRIGATION IR SOLN
Status: DC | PRN
  Administered 2018-01-19: 2000 mL

## 2018-01-19 MED ORDER — FAMOTIDINE 20 MG PO TABS: 20.0000 mg | ORAL_TABLET | Freq: Every day | ORAL | Status: DC | PRN

## 2018-01-19 MED ORDER — METHOCARBAMOL 500 MG IVPB - SIMPLE MED
500.0000 mg | Freq: Four times a day (QID) | INTRAVENOUS | Status: DC | PRN
  Filled 2018-01-19: qty 50

## 2018-01-19 MED ORDER — CEFAZOLIN SODIUM-DEXTROSE 2-4 GM/100ML-% IV SOLN
2.0000 g | INTRAVENOUS | Status: AC
  Administered 2018-01-19: 2 g via INTRAVENOUS
  Filled 2018-01-19: qty 100

## 2018-01-19 MED ORDER — DIPHENHYDRAMINE HCL 12.5 MG/5ML PO ELIX: 12.5000 mg | ORAL_SOLUTION | ORAL | Status: DC | PRN

## 2018-01-19 MED ORDER — BISACODYL 5 MG PO TBEC: 5.0000 mg | DELAYED_RELEASE_TABLET | Freq: Every day | ORAL | Status: DC | PRN

## 2018-01-19 MED ORDER — CHLORHEXIDINE GLUCONATE 4 % EX LIQD
60.0000 mL | Freq: Once | CUTANEOUS | Status: DC
  Filled 2018-01-19: qty 60

## 2018-01-19 MED ORDER — HYDROMORPHONE HCL 1 MG/ML IJ SOLN: 0.5000 mg | INTRAMUSCULAR | Status: DC | PRN

## 2018-01-19 MED ORDER — BUPIVACAINE-EPINEPHRINE (PF) 0.25% -1:200000 IJ SOLN
INTRAMUSCULAR | Status: AC
  Filled 2018-01-19: qty 30

## 2018-01-19 MED ORDER — MENTHOL 3 MG MT LOZG
1.0000 | LOZENGE | OROMUCOSAL | Status: DC | PRN
  Filled 2018-01-19: qty 9

## 2018-01-19 MED ORDER — METOCLOPRAMIDE HCL 5 MG/ML IJ SOLN: 10.0000 mg | Freq: Once | INTRAMUSCULAR | Status: DC | PRN

## 2018-01-19 MED ORDER — PROPOFOL 10 MG/ML IV BOLUS
INTRAVENOUS | Status: AC
  Filled 2018-01-19: qty 20

## 2018-01-19 MED ORDER — KETOROLAC TROMETHAMINE 30 MG/ML IJ SOLN
INTRAMUSCULAR | Status: AC
  Filled 2018-01-19: qty 1

## 2018-01-19 MED ORDER — ACETAMINOPHEN 500 MG PO TABS
1000.0000 mg | ORAL_TABLET | Freq: Four times a day (QID) | ORAL | Status: AC
  Administered 2018-01-19 – 2018-01-20 (×3): 1000 mg via ORAL
  Filled 2018-01-19 (×3): qty 2

## 2018-01-19 MED ORDER — BUPIVACAINE IN DEXTROSE 0.75-8.25 % IT SOLN
INTRATHECAL | Status: DC | PRN
  Administered 2018-01-19: 1.8 mL via INTRATHECAL

## 2018-01-19 MED ORDER — PHENOL 1.4 % MT LIQD: 1.0000 | OROMUCOSAL | Status: DC | PRN

## 2018-01-19 MED ORDER — HYDROMORPHONE HCL 1 MG/ML IJ SOLN: 0.2500 mg | INTRAMUSCULAR | Status: DC | PRN

## 2018-01-19 MED ORDER — BUPIVACAINE-EPINEPHRINE 0.25% -1:200000 IJ SOLN
INTRAMUSCULAR | Status: DC | PRN
  Administered 2018-01-19: 30 mL

## 2018-01-19 MED ORDER — MIDAZOLAM HCL 2 MG/2ML IJ SOLN
1.0000 mg | INTRAMUSCULAR | Status: DC
  Administered 2018-01-19: 1 mg via INTRAVENOUS
  Filled 2018-01-19: qty 2

## 2018-01-19 MED ORDER — SODIUM CHLORIDE 0.9 % IR SOLN
Status: DC | PRN
  Administered 2018-01-19: 1000 mL

## 2018-01-19 MED ORDER — ALUM & MAG HYDROXIDE-SIMETH 200-200-20 MG/5ML PO SUSP: 30.0000 mL | ORAL | Status: DC | PRN

## 2018-01-19 MED ORDER — RIVAROXABAN 10 MG PO TABS: 10.0000 mg | ORAL_TABLET | Freq: Every day | ORAL | 0 refills | Status: DC

## 2018-01-19 MED ORDER — MAGNESIUM CITRATE PO SOLN: 1.0000 | Freq: Once | ORAL | Status: DC | PRN

## 2018-01-19 MED ORDER — SODIUM CHLORIDE 0.9 % IV SOLN
INTRAVENOUS | Status: DC
  Administered 2018-01-19: 16:00:00 via INTRAVENOUS

## 2018-01-19 MED ORDER — ENOXAPARIN SODIUM 30 MG/0.3ML ~~LOC~~ SOLN
30.0000 mg | Freq: Two times a day (BID) | SUBCUTANEOUS | Status: DC
  Administered 2018-01-20 – 2018-01-22 (×5): 30 mg via SUBCUTANEOUS
  Filled 2018-01-19 (×5): qty 0.3

## 2018-01-19 MED ORDER — ALPRAZOLAM 0.25 MG PO TABS
0.2500 mg | ORAL_TABLET | Freq: Every day | ORAL | Status: DC
  Administered 2018-01-19 – 2018-01-20 (×2): 0.25 mg via ORAL
  Filled 2018-01-19 (×3): qty 1

## 2018-01-19 MED ORDER — ACETAMINOPHEN 325 MG PO TABS
325.0000 mg | ORAL_TABLET | Freq: Four times a day (QID) | ORAL | Status: DC | PRN
  Administered 2018-01-21: 325 mg via ORAL
  Administered 2018-01-21 – 2018-01-22 (×4): 650 mg via ORAL
  Filled 2018-01-19 (×4): qty 2
  Filled 2018-01-19: qty 1

## 2018-01-19 MED ORDER — BACLOFEN 10 MG PO TABS: 10.0000 mg | ORAL_TABLET | Freq: Every day | ORAL | 1 refills | Status: AC

## 2018-01-19 MED ORDER — DICYCLOMINE HCL 20 MG PO TABS
20.0000 mg | ORAL_TABLET | Freq: Two times a day (BID) | ORAL | Status: DC | PRN
  Filled 2018-01-19: qty 1

## 2018-01-19 MED ORDER — SODIUM CHLORIDE 0.9 % IJ SOLN
INTRAMUSCULAR | Status: DC | PRN
  Administered 2018-01-19: 30 mL

## 2018-01-19 MED ORDER — LACTATED RINGERS IV SOLN
INTRAVENOUS | Status: DC
  Administered 2018-01-19 – 2018-01-22 (×4): via INTRAVENOUS

## 2018-01-19 MED ORDER — POLYETHYLENE GLYCOL 3350 17 G PO PACK
17.0000 g | PACK | Freq: Every day | ORAL | Status: DC | PRN
  Administered 2018-01-20 – 2018-01-22 (×3): 17 g via ORAL
  Filled 2018-01-19 (×3): qty 1

## 2018-01-19 MED ORDER — KETOROLAC TROMETHAMINE 30 MG/ML IJ SOLN
INTRAMUSCULAR | Status: DC | PRN
  Administered 2018-01-19: 30 mg

## 2018-01-19 MED ORDER — OXYCODONE HCL 5 MG PO TABS
5.0000 mg | ORAL_TABLET | ORAL | Status: DC | PRN
  Administered 2018-01-20 (×2): 10 mg via ORAL
  Administered 2018-01-20: 5 mg via ORAL
  Filled 2018-01-19 (×2): qty 2
  Filled 2018-01-19: qty 1
  Filled 2018-01-19: qty 2

## 2018-01-19 MED ORDER — ONDANSETRON HCL 4 MG/2ML IJ SOLN: 4.0000 mg | Freq: Four times a day (QID) | INTRAMUSCULAR | Status: DC | PRN

## 2018-01-19 MED ORDER — PROPOFOL 500 MG/50ML IV EMUL
INTRAVENOUS | Status: DC | PRN
  Administered 2018-01-19: 100 ug/kg/min via INTRAVENOUS

## 2018-01-19 MED ORDER — ONDANSETRON HCL 4 MG/2ML IJ SOLN
INTRAMUSCULAR | Status: DC | PRN
  Administered 2018-01-19: 4 mg via INTRAVENOUS

## 2018-01-19 MED ORDER — SODIUM CHLORIDE 0.9 % IV SOLN
1000.0000 mg | INTRAVENOUS | Status: AC
  Administered 2018-01-19: 1000 mg via INTRAVENOUS
  Filled 2018-01-19: qty 10

## 2018-01-19 MED ORDER — CEFAZOLIN SODIUM-DEXTROSE 2-4 GM/100ML-% IV SOLN
2.0000 g | Freq: Four times a day (QID) | INTRAVENOUS | Status: AC
  Administered 2018-01-19 (×2): 2 g via INTRAVENOUS
  Filled 2018-01-19 (×2): qty 100

## 2018-01-19 MED ORDER — SODIUM CHLORIDE 0.9 % IJ SOLN
INTRAMUSCULAR | Status: AC
  Filled 2018-01-19: qty 50

## 2018-01-19 MED ORDER — FENTANYL CITRATE (PF) 100 MCG/2ML IJ SOLN
50.0000 ug | INTRAMUSCULAR | Status: DC
  Administered 2018-01-19: 50 ug via INTRAVENOUS
  Filled 2018-01-19: qty 2

## 2018-01-19 MED ORDER — METOCLOPRAMIDE HCL 5 MG PO TABS: 5.0000 mg | ORAL_TABLET | Freq: Three times a day (TID) | ORAL | Status: DC | PRN

## 2018-01-19 MED ORDER — PROPOFOL 10 MG/ML IV BOLUS
INTRAVENOUS | Status: AC
  Filled 2018-01-19: qty 40

## 2018-01-19 MED ORDER — ONDANSETRON HCL 4 MG PO TABS: 4.0000 mg | ORAL_TABLET | Freq: Four times a day (QID) | ORAL | Status: DC | PRN

## 2018-01-19 MED ORDER — MEPERIDINE HCL 50 MG/ML IJ SOLN: 6.2500 mg | INTRAMUSCULAR | Status: DC | PRN

## 2018-01-19 MED ORDER — OXYCODONE HCL 5 MG PO TABS
10.0000 mg | ORAL_TABLET | ORAL | Status: DC | PRN
  Administered 2018-01-21: 10 mg via ORAL

## 2018-01-19 MED ORDER — DOCUSATE SODIUM 100 MG PO CAPS
100.0000 mg | ORAL_CAPSULE | Freq: Two times a day (BID) | ORAL | Status: DC
  Administered 2018-01-20 – 2018-01-22 (×5): 100 mg via ORAL
  Filled 2018-01-19 (×6): qty 1

## 2018-01-19 MED ORDER — METOCLOPRAMIDE HCL 5 MG/ML IJ SOLN: 5.0000 mg | Freq: Three times a day (TID) | INTRAMUSCULAR | Status: DC | PRN

## 2018-01-19 MED ORDER — DEXAMETHASONE SODIUM PHOSPHATE 10 MG/ML IJ SOLN
10.0000 mg | Freq: Once | INTRAMUSCULAR | Status: AC
  Administered 2018-01-19: 10 mg via INTRAVENOUS

## 2018-01-19 MED ORDER — FERROUS SULFATE 325 (65 FE) MG PO TABS
325.0000 mg | ORAL_TABLET | Freq: Three times a day (TID) | ORAL | Status: DC
  Administered 2018-01-20 – 2018-01-22 (×8): 325 mg via ORAL
  Filled 2018-01-19 (×8): qty 1

## 2018-01-19 MED ORDER — METHOCARBAMOL 500 MG PO TABS
500.0000 mg | ORAL_TABLET | Freq: Four times a day (QID) | ORAL | Status: DC | PRN
  Administered 2018-01-20 – 2018-01-22 (×6): 500 mg via ORAL
  Filled 2018-01-19 (×6): qty 1

## 2018-01-19 MED ORDER — DEXAMETHASONE SODIUM PHOSPHATE 10 MG/ML IJ SOLN
10.0000 mg | Freq: Once | INTRAMUSCULAR | Status: AC
  Administered 2018-01-20: 10 mg via INTRAVENOUS
  Filled 2018-01-19: qty 1

## 2018-01-19 SURGICAL SUPPLY — 58 items
BAG DECANTER FOR FLEXI CONT (MISCELLANEOUS) IMPLANT
BAG ZIPLOCK 12X15 (MISCELLANEOUS) ×6 IMPLANT
BANDAGE ACE 4X5 VEL STRL LF (GAUZE/BANDAGES/DRESSINGS) ×3 IMPLANT
BANDAGE ACE 6X5 VEL STRL LF (GAUZE/BANDAGES/DRESSINGS) ×3 IMPLANT
BLADE SAG 18X100X1.27 (BLADE) ×3 IMPLANT
BLADE SAW SGTL 13.0X1.19X90.0M (BLADE) ×3 IMPLANT
BOWL SMART MIX CTS (DISPOSABLE) ×3 IMPLANT
CEMENT HV SMART SET (Cement) ×6 IMPLANT
CEMENT TIBIA MBT (Knees) ×1 IMPLANT
COVER SURGICAL LIGHT HANDLE (MISCELLANEOUS) ×3 IMPLANT
CUFF TOURN SGL QUICK 34 (TOURNIQUET CUFF) ×2
CUFF TRNQT CYL 34X4X40X1 (TOURNIQUET CUFF) ×1 IMPLANT
DECANTER SPIKE VIAL GLASS SM (MISCELLANEOUS) ×3 IMPLANT
DERMABOND ADVANCED (GAUZE/BANDAGES/DRESSINGS) ×2
DERMABOND ADVANCED .7 DNX12 (GAUZE/BANDAGES/DRESSINGS) ×1 IMPLANT
DRAPE U-SHAPE 47X51 STRL (DRAPES) ×3 IMPLANT
DRSG AQUACEL AG ADV 3.5X10 (GAUZE/BANDAGES/DRESSINGS) ×3 IMPLANT
DRSG TEGADERM 4X4.75 (GAUZE/BANDAGES/DRESSINGS) ×3 IMPLANT
DURAPREP 26ML APPLICATOR (WOUND CARE) ×6 IMPLANT
ELECT REM PT RETURN 15FT ADLT (MISCELLANEOUS) ×3 IMPLANT
EVACUATOR 1/8 PVC DRAIN (DRAIN) ×3 IMPLANT
FEMUR SIGMA PS SZ 3.0 R (Femur) ×3 IMPLANT
GAUZE SPONGE 2X2 8PLY STRL LF (GAUZE/BANDAGES/DRESSINGS) ×1 IMPLANT
GLOVE BIO SURGEON STRL SZ7.5 (GLOVE) ×6 IMPLANT
GLOVE BIOGEL PI IND STRL 8 (GLOVE) ×6 IMPLANT
GLOVE BIOGEL PI INDICATOR 8 (GLOVE) ×12
GLOVE ECLIPSE 8.0 STRL XLNG CF (GLOVE) ×6 IMPLANT
GLOVE SURG ORTHO 9.0 STRL STRW (GLOVE) ×3 IMPLANT
GOWN STRL REUS W/TWL XL LVL3 (GOWN DISPOSABLE) ×12 IMPLANT
HANDPIECE INTERPULSE COAX TIP (DISPOSABLE) ×2
HOLDER FOLEY CATH W/STRAP (MISCELLANEOUS) IMPLANT
IMMOBILIZER KNEE 20 (SOFTGOODS) ×3
IMMOBILIZER KNEE 20 THIGH 36 (SOFTGOODS) ×1 IMPLANT
INSERT TIBIAL PFC SIG SZ3 10MM (Knees) ×3 IMPLANT
PACK TOTAL KNEE CUSTOM (KITS) ×3 IMPLANT
PATELLA DOME PFC 32MM (Knees) ×3 IMPLANT
PIN STEINMAN FIXATION KNEE (PIN) ×3 IMPLANT
POSITIONER SURGICAL ARM (MISCELLANEOUS) ×3 IMPLANT
SET HNDPC FAN SPRY TIP SCT (DISPOSABLE) ×1 IMPLANT
SET PAD KNEE POSITIONER (MISCELLANEOUS) ×3 IMPLANT
SPONGE GAUZE 2X2 STER 10/PKG (GAUZE/BANDAGES/DRESSINGS) ×2
SPONGE LAP 18X18 RF (DISPOSABLE) IMPLANT
SPONGE SURGIFOAM ABS GEL 100 (HEMOSTASIS) ×3 IMPLANT
STOCKINETTE 6  STRL (DRAPES) ×2
STOCKINETTE 6 STRL (DRAPES) ×1 IMPLANT
SUT BONE WAX W31G (SUTURE) IMPLANT
SUT MNCRL AB 3-0 PS2 18 (SUTURE) ×3 IMPLANT
SUT VIC AB 1 CT1 27 (SUTURE) ×8
SUT VIC AB 1 CT1 27XBRD ANTBC (SUTURE) ×4 IMPLANT
SUT VIC AB 2-0 CT1 27 (SUTURE) ×4
SUT VIC AB 2-0 CT1 TAPERPNT 27 (SUTURE) ×2 IMPLANT
SUT VLOC 180 0 24IN GS25 (SUTURE) ×3 IMPLANT
SYR 50ML LL SCALE MARK (SYRINGE) ×3 IMPLANT
TAPE STRIPS DRAPE STRL (GAUZE/BANDAGES/DRESSINGS) ×3 IMPLANT
TIBIA MBT CEMENT (Knees) ×3 IMPLANT
TRAY FOLEY CATH 14FR (SET/KITS/TRAYS/PACK) ×3 IMPLANT
WRAP KNEE MAXI GEL POST OP (GAUZE/BANDAGES/DRESSINGS) ×3 IMPLANT
YANKAUER SUCT BULB TIP 10FT TU (MISCELLANEOUS) ×3 IMPLANT

## 2018-01-19 NOTE — Progress Notes (Signed)
AssistedDr. Foster with right, ultrasound guided, adductor canal block. Side rails up, monitors on throughout procedure. See vital signs in flow sheet. Tolerated Procedure well.  

## 2018-01-19 NOTE — Transfer of Care (Signed)
Immediate Anesthesia Transfer of Care Note  Patient: Alexandria Chapman  Procedure(s) Performed: RIGHT TOTAL KNEE ARTHROPLASTY (Right Knee)  Patient Location: PACU  Anesthesia Type:Regional and Spinal  Level of Consciousness: sedated  Airway & Oxygen Therapy: Patient Spontanous Breathing and Patient connected to face mask oxygen  Post-op Assessment: Report given to RN and Post -op Vital signs reviewed and stable  Post vital signs: Reviewed and stable  Last Vitals:  Vitals Value Taken Time  BP    Temp    Pulse 62 01/19/2018 12:28 PM  Resp    SpO2 94 % 01/19/2018 12:28 PM  Vitals shown include unvalidated device data.  Last Pain:  Vitals:   01/19/18 0929  TempSrc:   PainSc: 0-No pain         Complications: No apparent anesthesia complications

## 2018-01-19 NOTE — Interval H&P Note (Signed)
History and Physical Interval Note:  01/19/2018 9:55 AM  Alexandria Chapman  has presented today for surgery, with the diagnosis of right knee osteoarthritis  The various methods of treatment have been discussed with the patient and family. After consideration of risks, benefits and other options for treatment, the patient has consented to  Procedure(s): RIGHT TOTAL KNEE ARTHROPLASTY (Right) as a surgical intervention .  The patient's history has been reviewed, patient examined, no change in status, stable for surgery.  I have reviewed the patient's chart and labs.  Questions were answered to the patient's satisfaction.     Gaylen Pereira ANDREW

## 2018-01-19 NOTE — Evaluation (Signed)
Physical Therapy Evaluation Patient Details Name: Alexandria Chapman MRN: [id number] DOB: 1945-09-03 Today's Date: 01/19/2018   History of Present Illness  Pt is a 72 YO female s/p R TKR on 01/19/18. PMH includes anxiety, colonic diverticulitis, abscess, R shoulder impingement, IBS, GERD, RLE squamous cell cancer removal, knee scope, shoulder scope.   Clinical Impression   Pt s/p R TKR, highly motivated to mobilize and return to PLOF. Pt presents with increased time and effort to perform mobility tasks, decreased activity tolerance, and mild R knee pain. Pt to benefit from acute PT to address deficits. Pt ambulated 100 ft with RW with min guard assist. PT to continue to progress mobility, and will continue to follow acutely.     Follow Up Recommendations Follow surgeon's recommendation for DC plan and follow-up therapies(OPPT)    Equipment Recommendations  None recommended by PT    Recommendations for Other Services       Precautions / Restrictions Precautions Precautions: Knee;Fall Required Braces or Orthoses: Knee Immobilizer - Right Knee Immobilizer - Right: On when out of bed or walking;Discontinue once straight leg raise with < 10 degree lag Restrictions Weight Bearing Restrictions: No Other Position/Activity Restrictions: WBAT       Mobility  Bed Mobility Overal bed mobility: Needs Assistance Bed Mobility: Supine to Sit     Supine to sit: Min guard;HOB elevated     General bed mobility comments: Min guard for safety and in case pt needed LE assist. Increased time to perform.   Transfers Overall transfer level: Needs assistance Equipment used: Rolling walker (2 wheeled) Transfers: Sit to/from Stand Sit to Stand: Min guard         General transfer comment: Min guard for safety. Verbal cuing for hand placement.   Ambulation/Gait Ambulation/Gait assistance: Min guard Gait Distance (Feet): 100 Feet Assistive device: Rolling walker (2 wheeled) Gait Pattern/deviations:  Step-to pattern;Step-through pattern;Decreased weight shift to right Gait velocity: decr    General Gait Details: Min guard for safety. Verbal cuing for sequencing, turning.   Stairs            Wheelchair Mobility    Modified Rankin (Stroke Patients Only)       Balance Overall balance assessment: Mild deficits observed, not formally tested                                           Pertinent Vitals/Pain Pain Assessment: 0-10 Pain Score: 2  Pain Location: R knee  Pain Descriptors / Indicators: Aching Pain Intervention(s): Limited activity within patient's tolerance    Home Living Family/patient expects to be discharged to:: Private residence Living Arrangements: Alone(pt hired caregivers to assist at home upon d/c) Available Help at Discharge: Family Type of Home: House Home Access: Stairs to enter Entrance Stairs-Rails: Right Entrance Stairs-Number of Steps: 2 Home Layout: One level Home Equipment: Bedside commode;Walker - 2 wheels;Walker - 4 wheels      Prior Function Level of Independence: Independent         Comments: Pt reports doing LE strengthening exercises independently prior to surgery in order to prepare for TKR.      Hand Dominance   Dominant Hand: Right    Extremity/Trunk Assessment   Upper Extremity Assessment Upper Extremity Assessment: Overall WFL for tasks assessed    Lower Extremity Assessment Lower Extremity Assessment: Overall WFL for tasks assessed;RLE deficits/detail RLE Deficits / Details: Suspected post-surgical  weakness; able to perform quad sets x5, SLR x4 with donning/doffing immobilizer RLE Sensation: WNL    Cervical / Trunk Assessment Cervical / Trunk Assessment: Normal  Communication   Communication: No difficulties  Cognition Arousal/Alertness: Awake/alert Behavior During Therapy: WFL for tasks assessed/performed Overall Cognitive Status: Within Functional Limits for tasks assessed                                         General Comments      Exercises Total Joint Exercises Ankle Circles/Pumps: AROM;Both;10 reps;Seated Quad Sets: AROM;Right;5 reps;Supine Heel Slides: (encouraged to do heel slides x10 to tolerance (limited by pain) upon return to bed)   Assessment/Plan    PT Assessment Patient needs continued PT services  PT Problem List Decreased strength;Pain;Decreased range of motion;Decreased activity tolerance;Decreased knowledge of use of DME;Decreased balance;Decreased mobility       PT Treatment Interventions DME instruction;Therapeutic activities;Gait training;Patient/family education;Therapeutic exercise;Stair training;Balance training;Functional mobility training    PT Goals (Current goals can be found in the Care Plan section)  Acute Rehab PT Goals Patient Stated Goal: none stated  PT Goal Formulation: With patient Time For Goal Achievement: 02/02/18 Potential to Achieve Goals: Good    Frequency 7X/week   Barriers to discharge        Co-evaluation               AM-PAC PT "6 Clicks" Daily Activity  Outcome Measure Difficulty turning over in bed (including adjusting bedclothes, sheets and blankets)?: Unable Difficulty moving from lying on back to sitting on the side of the bed? : Unable Difficulty sitting down on and standing up from a chair with arms (e.g., wheelchair, bedside commode, etc,.)?: Unable Help needed moving to and from a bed to chair (including a wheelchair)?: None Help needed walking in hospital room?: A Little Help needed climbing 3-5 steps with a railing? : A Little 6 Click Score: 13    End of Session Equipment Utilized During Treatment: Gait belt Activity Tolerance: Patient tolerated treatment well Patient left: in chair;with call bell/phone within reach;with SCD's reapplied(Pt verbally agrees to not mobilize in any capacity without assist and without pressing call button for assist) Nurse Communication:  Mobility status PT Visit Diagnosis: Other abnormalities of gait and mobility (R26.89)    Time: 5427-0623 PT Time Calculation (min) (ACUTE ONLY): 29 min   Charges:   PT Evaluation $PT Eval Low Complexity: 1 Low PT Treatments $Gait Training: 8-22 mins       Julien Girt, PT Acute Rehabilitation Services Pager [phone]  Office [phone]   Elane Peabody D Elonda Husky 01/19/2018, 7:41 PM

## 2018-01-19 NOTE — Anesthesia Procedure Notes (Signed)
Spinal  Patient location during procedure: OR Start time: 01/19/2018 10:25 AM End time: 01/19/2018 10:28 AM Staffing Anesthesiologist: Josephine Igo, MD Performed: anesthesiologist  Preanesthetic Checklist Completed: patient identified, site marked, surgical consent, pre-op evaluation, timeout performed, IV checked, risks and benefits discussed and monitors and equipment checked Spinal Block Patient position: sitting Prep: site prepped and draped and DuraPrep Patient monitoring: heart rate, cardiac monitor, continuous pulse ox and blood pressure Approach: midline Location: L3-4 Injection technique: single-shot Needle Needle type: Pencan  Needle gauge: 24 G Needle length: 9 cm Needle insertion depth: 5 cm Assessment Sensory level: T4 Additional Notes Patient tolerated procedure well. Adequate sensory level.

## 2018-01-19 NOTE — Care Management Note (Signed)
Case Management Note  Patient Details  Name: Alexandria Chapman MRN: [id number] Date of Birth: [birth year]  Subjective/Objective:  Spoke with patient at bedside. Confirmed plan for OP PT, already arranged. Has RW and 3n1. [phone]                  Action/Plan:   Expected Discharge Date:  01/19/18               Expected Discharge Plan:  OP Rehab  In-House Referral:  NA  Discharge planning Services  CM Consult  Post Acute Care Choice:  NA Choice offered to:  Patient  DME Arranged:  N/A DME Agency:  NA  HH Arranged:  NA HH Agency:  NA  Status of Service:  Completed, signed off  If discussed at [place] of Stay Meetings, dates discussed:    Additional Comments:  Guadalupe Maple, RN 01/19/2018, 3:46 PM

## 2018-01-19 NOTE — Op Note (Signed)
DATE OF SURGERY:  01/19/2018  TIME: 12:17 PM  PATIENT NAME:  Alexandria Chapman    AGE: 72 y.o.   PRE-OPERATIVE DIAGNOSIS:  right knee osteoarthritis  POST-OPERATIVE DIAGNOSIS:  right knee osteoarthritis  PROCEDURE:  Procedure(s): RIGHT TOTAL KNEE ARTHROPLASTY  SURGEON:  Inetta Dicke ANDREW  ASSISTANT:  Bryson Stilwell, PA-C, present and scrubbed throughout the case, critical for assistance with exposure, retraction, instrumentation, and closure.  OPERATIVE IMPLANTS: Depuy PFC Sigma Rotating Platform.  Femur size 3, Tibia size 3, Patella size 32 3-peg oval button, with a 10 mm polyethylene insert.   PREOPERATIVE INDICATIONS:   Alexandria Chapman is a 72 y.o. year old female with end stage bone on bone arthritis of the knee who failed conservative treatment and elected for Total Knee Arthroplasty.   The risks, benefits, and alternatives were discussed at length including but not limited to the risks of infection, bleeding, nerve injury, stiffness, blood clots, the need for revision surgery, cardiopulmonary complications, among others, and they were willing to proceed.  OPERATIVE DESCRIPTION:  The patient was brought to the operative room and placed in a supine position.  Spinal anesthesia was administered.  IV antibiotics were given.  The lower extremity was prepped and draped in the usual sterile fashion.  Time out was performed.  The leg was elevated and exsanguinated and the tourniquet was inflated.  Anterior quadriceps tendon splitting approach was performed.  The patella was retracted and osteophytes were removed.  The anterior horn of the medial and lateral meniscus was removed and cruciate ligaments resected.   The distal femur was opened with the drill and the intramedullary distal femoral cutting jig was utilized, set at 5 degrees resecting 10 mm off the distal femur.  Care was taken to protect the collateral ligaments.  The distal femoral sizing jig was applied, taking care to  avoid notching.  Then the 4-in-1 cutting jig was applied and the anterior and posterior femur was cut, along with the chamfer cuts.    Then the extramedullary tibial cutting jig was utilized making the appropriate cut using the anterior tibial crest as a reference building in appropriate posterior slope.  Care was taken during the cut to protect the medial and collateral ligaments.  The proximal tibia was removed along with the posterior horns of the menisci.   The posterior medial femoral osteophytes and posterior lateral femoral osteophytes were removed.    The flexion gap was then measured and was symmetric with the extension gap, measured at 10.  I completed the distal femoral preparation using the appropriate jig to prepare the box.  The patella was then measured, and cut with the saw.    The proximal tibia sized and prepared accordingly with the reamer and the punch, and then all components were trialed with the trial insert.  The knee was found to have excellent balance and full motion.    The above named components were then cemented into place and all excess cement was removed.  The trial polyethylene component was in place during cementation, and then was exchanged for the real polyethylene component.    The knee was easily taken through a range of motion and the patella tracked well and the knee irrigated copiously and the parapatellar and subcutaneous tissue closed with vicryl, and monocryl with steri strips for the skin.  The arthrotomy was closed at 90 of flexion. The wounds were dressed with sterile gauze and the tourniquet released and the patient was awakened and returned to the PACU in stable  and satisfactory condition.  There were no complications.  Total tourniquet time was 60 minutes.

## 2018-01-19 NOTE — Anesthesia Postprocedure Evaluation (Signed)
Anesthesia Post Note  Patient: Alexandria Chapman  Procedure(s) Performed: RIGHT TOTAL KNEE ARTHROPLASTY (Right Knee)     Patient location during evaluation: PACU Anesthesia Type: Spinal Level of consciousness: oriented and awake and alert Pain management: pain level controlled Vital Signs Assessment: post-procedure vital signs reviewed and stable Respiratory status: spontaneous breathing, respiratory function stable and nonlabored ventilation Cardiovascular status: blood pressure returned to baseline and stable Postop Assessment: no headache, no backache, no apparent nausea or vomiting, patient able to bend at knees and spinal receding Anesthetic complications: no    Last Vitals:  Vitals:   01/19/18 1245 01/19/18 1330  BP: 124/64   Pulse: (!) 51   Resp: 11   Temp:  36.4 C  SpO2: 100%     Last Pain:  Vitals:   01/19/18 1330  TempSrc:   PainSc: 0-No pain                 Ersel Enslin A.

## 2018-01-19 NOTE — Anesthesia Preprocedure Evaluation (Addendum)
Anesthesia Evaluation  Patient identified by MRN, date of birth, ID band Patient awake    Reviewed: Allergy & Precautions, NPO status , Patient's Chart, lab work & pertinent test results  Airway Mallampati: II  TM Distance: >3 FB Neck ROM: Full    Dental no notable dental hx. (+) Upper Dentures   Pulmonary former smoker,    Pulmonary exam normal breath sounds clear to auscultation       Cardiovascular negative cardio ROS Normal cardiovascular exam Rhythm:Regular Rate:Normal     Neuro/Psych PSYCHIATRIC DISORDERS Anxiety Congenital deafness    GI/Hepatic Neg liver ROS, GERD  Medicated and Controlled,IBS   Endo/Other  Goiter  Renal/GU negative Renal ROS  negative genitourinary   Musculoskeletal  (+) Arthritis , Osteoarthritis,  OA right knee Levoscoliosis lumbar spine   Abdominal   Peds  Hematology negative hematology ROS (+)   Anesthesia Other Findings   Reproductive/Obstetrics                            Anesthesia Physical Anesthesia Plan  ASA: II  Anesthesia Plan: Spinal   Post-op Pain Management:  Regional for Post-op pain   Induction:   PONV Risk Score and Plan: 2 and Ondansetron, Propofol infusion and Treatment may vary due to age or medical condition  Airway Management Planned: Natural Airway, Nasal Cannula and Simple Face Mask  Additional Equipment:   Intra-op Plan:   Post-operative Plan:   Informed Consent: I have reviewed the patients History and Physical, chart, labs and discussed the procedure including the risks, benefits and alternatives for the proposed anesthesia with the patient or authorized representative who has indicated his/her understanding and acceptance.   Dental advisory given  Plan Discussed with: CRNA and Surgeon  Anesthesia Plan Comments:         Anesthesia Quick Evaluation

## 2018-01-19 NOTE — Interval H&P Note (Signed)
History and Physical Interval Note:  01/19/2018 9:56 AM  Alexandria Chapman  has presented today for surgery, with the diagnosis of right knee osteoarthritis  The various methods of treatment have been discussed with the patient and family. After consideration of risks, benefits and other options for treatment, the patient has consented to  Procedure(s): RIGHT TOTAL KNEE ARTHROPLASTY (Right) as a surgical intervention .  The patient's history has been reviewed, patient examined, no change in status, stable for surgery.  I have reviewed the patient's chart and labs.  Questions were answered to the patient's satisfaction.     Rozalynn Buege ANDREW

## 2018-01-19 NOTE — Interval H&P Note (Signed)
History and Physical Interval Note:  01/19/2018 9:56 AM  Alexandria Chapman  has presented today for surgery, with the diagnosis of right knee osteoarthritis  The various methods of treatment have been discussed with the patient and family. After consideration of risks, benefits and other options for treatment, the patient has consented to  Procedure(s): RIGHT TOTAL KNEE ARTHROPLASTY (Right) as a surgical intervention .  The patient's history has been reviewed, patient examined, no change in status, stable for surgery.  I have reviewed the patient's chart and labs.  Questions were answered to the patient's satisfaction.     Chaz Mcglasson ANDREW

## 2018-01-20 LAB — CBC
HCT: 36 % (ref 36.0–46.0)
HEMOGLOBIN: 11.9 g/dL — AB (ref 12.0–15.0)
MCH: 29 pg (ref 26.0–34.0)
MCHC: 33.1 g/dL (ref 30.0–36.0)
MCV: 87.6 fL (ref 78.0–100.0)
Platelets: 277 10*3/uL (ref 150–400)
RBC: 4.11 MIL/uL (ref 3.87–5.11)
RDW: 12.7 % (ref 11.5–15.5)
WBC: 13 10*3/uL — AB (ref 4.0–10.5)

## 2018-01-20 LAB — BASIC METABOLIC PANEL
Anion gap: 8 (ref 5–15)
BUN: 17 mg/dL (ref 8–23)
CHLORIDE: 100 mmol/L (ref 98–111)
CO2: 27 mmol/L (ref 22–32)
CREATININE: 0.8 mg/dL (ref 0.44–1.00)
Calcium: 8.6 mg/dL — ABNORMAL LOW (ref 8.9–10.3)
GFR calc Af Amer: >60 mL/min (ref >60)
GFR calc non Af Amer: >60 mL/min (ref >60)
GLUCOSE: 123 mg/dL — AB (ref 70–99)
Potassium: 4.3 mmol/L (ref 3.5–5.1)
Sodium: 135 mmol/L (ref 135–145)

## 2018-01-20 MED ORDER — ALUM & MAG HYDROXIDE-SIMETH 200-200-20 MG/5ML PO SUSP: 30.0000 mL | ORAL | Status: DC | PRN

## 2018-01-20 NOTE — Progress Notes (Signed)
    Subjective:  Patient reports pain as mild to moderate.  Denies N/V/CP/SOB. No c/o.  Objective:   VITALS:   Vitals:   01/19/18 2138 01/20/18 0117 01/20/18 0531 01/20/18 1003  BP: 119/73 115/67 133/72 (!) 159/77  Pulse: (!) 59 (!) 57 (!) 56 (!) 57  Resp: 17 16 16    Temp: (!) 97.5 F (36.4 C) 97.7 F (36.5 C) 97.8 F (36.6 C) 97.7 F (36.5 C)  TempSrc: Oral Oral Oral Oral  SpO2: 98% 96% 98% 100%  Weight:      Height:        NAD ABD soft Sensation intact distally Intact pulses distally Dorsiflexion/Plantar flexion intact Incision: dressing C/D/I Compartment soft HV ss  Lab Results  Component Value Date   WBC 13.0 (H) 01/20/2018   HGB 11.9 (L) 01/20/2018   HCT 36.0 01/20/2018   MCV 87.6 01/20/2018   PLT 277 01/20/2018   BMET    Component Value Date/Time   NA 135 01/20/2018 0351   K 4.3 01/20/2018 0351   CL 100 01/20/2018 0351   CO2 27 01/20/2018 0351   GLUCOSE 123 (H) 01/20/2018 0351   BUN 17 01/20/2018 0351   CREATININE 0.80 01/20/2018 0351   CALCIUM 8.6 (L) 01/20/2018 0351   GFRNONAA >60 01/20/2018 0351   GFRAA >60 01/20/2018 0351     Assessment/Plan: 1 Day Post-Op   Active Problems:   Osteoarthritis of right knee   S/P knee replacement   WBAT with walker DVT ppx: Lovenox, SCDs, TEDS PO pain control PT/OT Dispo: D/C hv drain, D/C home tomorrow with HHPT    Hilton Cork Lavetta Geier 01/20/2018, 10:09 AM   Rod Can, MD Cell [phone]

## 2018-01-20 NOTE — Progress Notes (Signed)
Physical Therapy Treatment Patient Details Name: Alexandria Chapman MRN: [id number] DOB: 18-Oct-1945 Today's Date: 01/20/2018    History of Present Illness Pt is a 72 YO female s/p R TKR on 01/19/18. PMH includes anxiety, colonic diverticulitis, abscess, R shoulder impingement, IBS, GERD, RLE squamous cell cancer removal, knee scope, shoulder scope.     PT Comments    Patient is progressing well. Plans DC tomorrow.    Follow Up Recommendations  Follow surgeon's recommendation for DC plan and follow-up therapies;Outpatient PT     Equipment Recommendations    none   Recommendations for Other Services       Precautions / Restrictions Precautions Precautions: Knee;Fall Precaution Comments: did not wear KI    Mobility  Bed Mobility Overal bed mobility: Modified Independent                Transfers   Equipment used: Rolling walker (2 wheeled) Transfers: Sit to/from Stand Sit to Stand: Supervision            Ambulation/Gait Ambulation/Gait assistance: Supervision Gait Distance (Feet): 200 Feet Assistive device: Rolling walker (2 wheeled) Gait Pattern/deviations: Step-through pattern     General Gait Details: sequence and gait is smoothe, barely antalgic   Stairs             Wheelchair Mobility    Modified Rankin (Stroke Patients Only)       Balance                                            Cognition Arousal/Alertness: Awake/alert                                            Exercises Total Joint Exercises Ankle Circles/Pumps: AROM;Both;10 reps;Seated Quad Sets: AROM;Right;5 reps;Supine Short Arc Quad: AROM;Supine;Right;10 reps Heel Slides: AAROM;Right;10 reps;Supine Hip ABduction/ADduction: AROM;Right;10 reps;Supine Straight Leg Raises: AROM;Right;10 reps;Supine Long Arc Quad: AROM;Right;10 reps;Seated Knee Flexion: AROM;Right;10 reps;Seated Goniometric ROM: 0-60 right knee flexion    General Comments         Pertinent Vitals/Pain Pain Score: 2  Pain Location: R knee  Pain Descriptors / Indicators: Tender Pain Intervention(s): Monitored during session;Premedicated before session;Ice applied    Home Living                      Prior Function            PT Goals (current goals can now be found in the care plan section) Progress towards PT goals: Progressing toward goals    Frequency    7X/week      PT Plan Current plan remains appropriate    Co-evaluation              AM-PAC PT "6 Clicks" Daily Activity  Outcome Measure  Difficulty turning over in bed (including adjusting bedclothes, sheets and blankets)?: None Difficulty moving from lying on back to sitting on the side of the bed? : None Difficulty sitting down on and standing up from a chair with arms (e.g., wheelchair, bedside commode, etc,.)?: A Little Help needed moving to and from a bed to chair (including a wheelchair)?: A Little Help needed walking in hospital room?: A Little Help needed climbing 3-5 steps with a railing? : A Lot 6 Click Score:  19    End of Session   Activity Tolerance: Patient tolerated treatment well Patient left: in chair;with call bell/phone within reach Nurse Communication: Mobility status PT Visit Diagnosis: Unsteadiness on feet (R26.81)     Time: 0658-2608 PT Time Calculation (min) (ACUTE ONLY): 33 min  Charges:  $Gait Training: 8-22 mins $Therapeutic Exercise: 8-22 mins                     Alexandria Chapman PT Acute Rehabilitation Services Pager [phone] Office [phone]    Alexandria Chapman 01/20/2018, 1:31 PM

## 2018-01-20 NOTE — Progress Notes (Signed)
Patient found with five pillows under leg and a few had slid down towards her knee. Patient educated on reason why pillows under the knee should be avoided. Patient stated that she is having difficulty bending knee. RN reiterated the reason for not having the pillows under the knee and explained that she could do her bending exercises to help with the stiffness.  Carlynn Herald, RN 01/20/18 09:21pm

## 2018-01-20 NOTE — Progress Notes (Signed)
Physical Therapy Treatment Patient Details Name: Alexandria Chapman MRN: [id number] DOB: August 02, 1945 Today's Date: 01/20/2018    History of Present Illness Pt is a 72 YO female s/p R TKR on 01/19/18. PMH includes anxiety, colonic diverticulitis, abscess, R shoulder impingement, IBS, GERD, RLE squamous cell cancer removal, knee scope, shoulder scope.     PT Comments    The patient is progressing very well. Plans DC tomorrow. Has practiced steps.   Follow Up Recommendations  Follow surgeon's recommendation for DC plan and follow-up therapies;Outpatient PT     Equipment Recommendations  None recommended by PT    Recommendations for Other Services       Precautions / Restrictions Precautions Precautions: Knee;Fall Precaution Comments: did not wear KI    Mobility  Bed Mobility Overal bed mobility: Independent                Transfers Overall transfer level: Modified independent                  Ambulation/Gait Ambulation/Gait assistance: Supervision Gait Distance (Feet): 600 Feet Assistive device: Rolling walker (2 wheeled) Gait Pattern/deviations: Step-through pattern     General Gait Details: sequence and gait is smoothe, barely antalgic   Stairs Stairs: Yes Stairs assistance: Min guard Stair Management: One rail Right;Forwards Number of Stairs: 2 General stair comments: no problems with steps   Wheelchair Mobility    Modified Rankin (Stroke Patients Only)       Balance                                            Cognition Arousal/Alertness: Awake/alert                                            Exercises      General Comments        Pertinent Vitals/Pain Pain Score: 2  Pain Location: R knee  Pain Descriptors / Indicators: Tender Pain Intervention(s): Monitored during session;Premedicated before session;Ice applied;Repositioned    Home Living                      Prior Function             PT Goals (current goals can now be found in the care plan section) Progress towards PT goals: Progressing toward goals    Frequency    7X/week      PT Plan Current plan remains appropriate    Co-evaluation              AM-PAC PT "6 Clicks" Daily Activity  Outcome Measure  Difficulty turning over in bed (including adjusting bedclothes, sheets and blankets)?: None Difficulty moving from lying on back to sitting on the side of the bed? : None Difficulty sitting down on and standing up from a chair with arms (e.g., wheelchair, bedside commode, etc,.)?: None Help needed moving to and from a bed to chair (including a wheelchair)?: None Help needed walking in hospital room?: A Little Help needed climbing 3-5 steps with a railing? : A Little 6 Click Score: 22    End of Session   Activity Tolerance: Patient tolerated treatment well Patient left: in bed;with bed alarm set;with call bell/phone within reach Nurse Communication: Mobility status PT Visit  Diagnosis: Unsteadiness on feet (R26.81)     Time: 0699-9672 PT Time Calculation (min) (ACUTE ONLY): 33 min  Charges:  $Gait Training: 23-37 mins                     [place] Pager [phone] Office [phone]    [name] 01/20/2018, 5:59 PM

## 2018-01-21 ENCOUNTER — Inpatient Hospital Stay (HOSPITAL_COMMUNITY): Payer: MEDICARE

## 2018-01-21 DIAGNOSIS — R42 Dizziness and giddiness: Secondary | ICD-10-CM

## 2018-01-21 DIAGNOSIS — R55 Syncope and collapse: Secondary | ICD-10-CM

## 2018-01-21 DIAGNOSIS — I340 Nonrheumatic mitral (valve) insufficiency: Secondary | ICD-10-CM

## 2018-01-21 LAB — CBC
HEMATOCRIT: 32.2 % — AB (ref 36.0–46.0)
HEMOGLOBIN: 10.6 g/dL — AB (ref 12.0–15.0)
MCH: 28.6 pg (ref 26.0–34.0)
MCHC: 32.9 g/dL (ref 30.0–36.0)
MCV: 87 fL (ref 78.0–100.0)
PLATELETS: 251 10*3/uL (ref 150–400)
RBC: 3.7 MIL/uL — AB (ref 3.87–5.11)
RDW: 12.9 % (ref 11.5–15.5)
WBC: 12.1 10*3/uL — ABNORMAL HIGH (ref 4.0–10.5)

## 2018-01-21 LAB — COMPREHENSIVE METABOLIC PANEL
ALT: 15 U/L (ref 0–44)
AST: 26 U/L (ref 15–41)
Albumin: 3.5 g/dL (ref 3.5–5.0)
Alkaline Phosphatase: 50 U/L (ref 38–126)
Anion gap: 10 (ref 5–15)
BILIRUBIN TOTAL: 0.9 mg/dL (ref 0.3–1.2)
BUN: 17 mg/dL (ref 8–23)
CHLORIDE: 96 mmol/L — AB (ref 98–111)
CO2: 27 mmol/L (ref 22–32)
CREATININE: 0.77 mg/dL (ref 0.44–1.00)
Calcium: 8.8 mg/dL — ABNORMAL LOW (ref 8.9–10.3)
Glucose, Bld: 172 mg/dL — ABNORMAL HIGH (ref 70–99)
Potassium: 3.7 mmol/L (ref 3.5–5.1)
Sodium: 133 mmol/L — ABNORMAL LOW (ref 135–145)
TOTAL PROTEIN: 6.9 g/dL (ref 6.5–8.1)

## 2018-01-21 LAB — ECHOCARDIOGRAM COMPLETE
Height: 66 in
Weight: 2035.29 oz

## 2018-01-21 LAB — URINALYSIS, ROUTINE W REFLEX MICROSCOPIC
Bilirubin Urine: NEGATIVE
GLUCOSE, UA: NEGATIVE mg/dL
HGB URINE DIPSTICK: NEGATIVE
KETONES UR: NEGATIVE mg/dL
Leukocytes, UA: NEGATIVE
Nitrite: NEGATIVE
PH: 6 (ref 5.0–8.0)
PROTEIN: NEGATIVE mg/dL
Specific Gravity, Urine: 1.016 (ref 1.005–1.030)

## 2018-01-21 LAB — TROPONIN I
Troponin I: <0.03 ng/mL (ref <0.03)
Troponin I: <0.03 ng/mL (ref <0.03)
Troponin I: <0.03 ng/mL (ref <0.03)

## 2018-01-21 MED ORDER — HYDROCODONE-ACETAMINOPHEN 5-325 MG PO TABS: 1.0000 | ORAL_TABLET | ORAL | Status: DC | PRN

## 2018-01-21 MED ORDER — TRAZODONE HCL 50 MG PO TABS
50.0000 mg | ORAL_TABLET | Freq: Once | ORAL | Status: AC
  Administered 2018-01-21: 50 mg via ORAL
  Filled 2018-01-21: qty 1

## 2018-01-21 MED ORDER — TRAMADOL HCL 50 MG PO TABS
50.0000 mg | ORAL_TABLET | Freq: Four times a day (QID) | ORAL | Status: DC | PRN
  Administered 2018-01-21 – 2018-01-22 (×5): 50 mg via ORAL
  Filled 2018-01-21 (×5): qty 1

## 2018-01-21 NOTE — Progress Notes (Signed)
Report was given to Ginger, RN at 1103. Pt is being transferred to room 1416 per telemetry order d/t syncope episode at 0630 this morning.

## 2018-01-21 NOTE — Progress Notes (Addendum)
PT Cancellation Note  Patient Details Name: Alexandria Chapman MRN: [id number] DOB: [birth year]   Cancelled Treatment:    Reason Eval/Treat Not Completed: Patient not medically ready. Pt with episode of decreased consciousness overnight. Ortho okayed bed level PT this AM.  PT unable to see pt due to getting an EKG.  Will check back.  Addendum: Checked on patient in the afternoon.  She had been transferred to telemetry floor. Pt was getting ECHO at time of PT attempt.  Will check on pt tomorrow.   Galen Manila 01/21/2018, 9:02 AM

## 2018-01-21 NOTE — Consult Note (Signed)
Medical Consultation   Alexandria Chapman  EZM:[id number]  DOB: 13-Dec-1945  DOA: 01/19/2018  PCP: Nicholes Rough, PA-C    Outpatient Specialists: [facility] orthopedics    Requesting physician: Dr Lyla Glassing  Reason for consultation: presyncope   History of Present Illness: Alexandria Chapman is an 72 y.o. female with past medical history relevant for anxiety, IBS, osteoarthritis who was admitted for right total knee replacement on 01/19/2018 and was pending discharge today.  Her postoperative course was fairly uneventful until yesterday when she began to notice diffuse tremulousness of unclear etiology.  She was not overly concerned about this.  Today while getting up to go to the bathroom she began to have a presyncopal episode from which was noted by the nurse who is helping her.  During this time she reported feeling "woozy" as well as feeling of warmth spreading over her face and felt her vision slightly dim.  She did not have any chest pain or palpitations during this time.  She recovered by being placed back in bed.  She otherwise reports only pain from her surgery site on the right knee.  She denies any nausea, vomiting, dumping, cough, congestion, orthopnea, PND, lower extremity edema.  She does report a history of a reportedly benign murmur but does not member any history of an echocardiogram.  She reports being quite active outside and reports ambulating significantly as part of her physical therapy and her enjoyment of hiking and reports no chest pain during this time.  She denies any family history of early cardiac disease.   Review of Systems:  ROS As per HPI otherwise 10 point review of systems negative.     Past Medical History: Past Medical History:  Diagnosis Date  . Anxiety   . Cancer ([place])    RLE squamous cell removed  . TMJ disorder involving articular disc abnormality     Past Surgical History: Past Surgical History:  Procedure Laterality Date  . ABDOMINAL  HYSTERECTOMY    . APPENDECTOMY    . CHOLECYSTECTOMY    . CYST EXCISION Right 10/06/2015   Procedure: RIGHT LONG FINGER EXCISION MUCOID CYST;  Surgeon: Leanora Cover, MD;  Location: [facility];  Service: Orthopedics;  Laterality: Right;  Bier block  . KNEE ARTHROSCOPY    . OOPHORECTOMY    . PAROTID GLAND TUMOR EXCISION Left   . SHOULDER ARTHROSCOPY    . TONSILLECTOMY       Allergies:   Allergies  Allergen Reactions  . Sulfa Antibiotics Anaphylaxis  . Erythromycin     Bloody stool  . Lexapro [Escitalopram Oxalate]     "spacey" flu like symptoms   . Nsaids     Bloody stool, abdominal pain  . Pantoprazole Sodium Nausea Only  . Sucralfate     Unknown reaction  . Voltaren [Diclofenac Sodium] Other (See Comments)    Bloody stool, abdominal pain  . Sudafed [Pseudoephedrine Hcl] Palpitations and Other (See Comments)    Dizziness, fainting      Social History:  reports that she has quit smoking. She quit after 2.00 years of use. She has never used smokeless tobacco. She reports that she drinks alcohol. She reports that she does not use drugs.   Family History: Family History  Problem Relation Age of Onset  . Alcohol abuse Mother   . Bipolar disorder Mother   . Alcohol abuse Maternal Uncle   . Drug abuse Cousin   .  OCD Other      Physical Exam: Vitals:   01/20/18 1204 01/20/18 2055 01/21/18 0613 01/21/18 0655  BP: (!) 154/70 (!) 169/75 (!) 110/50 124/60  Pulse: (!) 55 62 62 72  Resp:  16 16 16   Temp: 98 F (36.7 C) (!) 97.5 F (36.4 C) 98.2 F (36.8 C) 97.7 F (36.5 C)  TempSrc: Oral Oral Oral Oral  SpO2: 98% 100% 95% 100%  Weight:      Height:        Constitutional: Appearance,  Alert and awake, oriented x3, not in any acute distress. Eyes: Anicteric sclera ENMT: Moist mucous membranes Neck: neck appears normal, no masses CVS: S1-S2 clear, 2 out of 6 systolic murmur heard best at left upper sternal border Respiratory:  clear to auscultation  bilaterally, no wheezing, rales or rhonchi. Respiratory effort normal. No accessory muscle use.  Abdomen: soft nontender, nondistended, normal bowel sounds, no hepatosplenomegaly, no hernias  Musculoskeletal: : No lower extremity edema, right lower extremity wrapped Neuro: Grossly intact, moving all extremities Psych: judgement and insight appear normal, stable mood and affect, mental status Skin: Incision site wrapped   Data reviewed:  I have personally reviewed following labs and imaging studies Labs:  CBC: Recent Labs  Lab 01/20/18 0351 01/21/18 0403  WBC 13.0* 12.1*  HGB 11.9* 10.6*  HCT 36.0 32.2*  MCV 87.6 87.0  PLT 277 009    Basic Metabolic Panel: Recent Labs  Lab 01/20/18 0351  NA 135  K 4.3  CL 100  CO2 27  GLUCOSE 123*  BUN 17  CREATININE 0.80  CALCIUM 8.6*   GFR Estimated Creatinine Clearance: 58.8 mL/min (by C-G formula based on SCr of 0.8 mg/dL). Liver Function Tests: No results for input(s): AST, ALT, ALKPHOS, BILITOT, PROT, ALBUMIN in the last 168 hours. No results for input(s): LIPASE, AMYLASE in the last 168 hours. No results for input(s): AMMONIA in the last 168 hours. Coagulation profile No results for input(s): INR, PROTIME in the last 168 hours.  Cardiac Enzymes: No results for input(s): CKTOTAL, CKMB, CKMBINDEX, TROPONINI in the last 168 hours. BNP: Invalid input(s): POCBNP CBG: No results for input(s): GLUCAP in the last 168 hours. D-Dimer No results for input(s): DDIMER in the last 72 hours. Hgb A1c No results for input(s): HGBA1C in the last 72 hours. Lipid Profile No results for input(s): CHOL, HDL, LDLCALC, TRIG, CHOLHDL, LDLDIRECT in the last 72 hours. Thyroid function studies No results for input(s): TSH, T4TOTAL, T3FREE, THYROIDAB in the last 72 hours.  Invalid input(s): FREET3 Anemia work up No results for input(s): VITAMINB12, FOLATE, FERRITIN, TIBC, IRON, RETICCTPCT in the last 72 hours. Urinalysis    Component Value  Date/Time   COLORURINE STRAW (A) 01/11/2018 1121   APPEARANCEUR CLEAR 01/11/2018 1121   LABSPEC 1.003 (L) 01/11/2018 1121   PHURINE 7.0 01/11/2018 1121   GLUCOSEU NEGATIVE 01/11/2018 1121   HGBUR NEGATIVE 01/11/2018 1121   BILIRUBINUR NEGATIVE 01/11/2018 1121   KETONESUR NEGATIVE 01/11/2018 1121   PROTEINUR NEGATIVE 01/11/2018 1121   NITRITE NEGATIVE 01/11/2018 1121   LEUKOCYTESUR NEGATIVE 01/11/2018 1121     Sepsis Labs Invalid input(s): PROCALCITONIN,  WBC,  LACTICIDVEN Microbiology Recent Results (from the past 240 hour(s))  Surgical pcr screen     Status: None   Collection Time: 01/11/18 11:21 AM  Result Value Ref Range Status   MRSA, PCR NEGATIVE NEGATIVE Final   Staphylococcus aureus NEGATIVE NEGATIVE Final    Comment: (NOTE) The Xpert SA Assay (FDA approved for NASAL  specimens in patients 12 years of age and older), is one component of a comprehensive surveillance program. It is not intended to diagnose infection nor to guide or monitor treatment. Performed at [facility], [place] [address], [place], [place] [address]        Inpatient Medications:   Scheduled Meds: . ALPRAZolam  0.25 mg Oral QHS  . chlorhexidine  60 mL Topical Once  . docusate sodium  100 mg Oral BID  . enoxaparin (LOVENOX) injection  30 mg Subcutaneous Q12H  . ferrous sulfate  325 mg Oral TID PC   Continuous Infusions: . lactated ringers Stopped (01/19/18 1557)     Radiological Exams on Admission: No results found.  Impression/Recommendations Active Problems:   Osteoarthritis of right knee   S/P knee replacement   #) Presyncope: Suspect patient's presyncope is noncardiac in nature.  Strongest suspicion would be possibly related to medication including an adverse effect from the oxycodone versus possibly related to orthostatic syncope in the setting of postsurgical fluid shifts as well as advanced age.  She has no high risk features on her syncope other than the  murmur.  She denies any relevant cardiac history. -Transfer to telemetry bed -Follow-up EKG -Echo for murmur -Orthostatic vitals ordered -Discontinue oxycodone, change to tramadol as needed  #) Anxiety/tremulousness: Suspect most likely related to anxiety the patient denies any anxiety at this time.  Additionally tremulousness can be seen at high doses of opiates however unlikely at lower doses she is currently taking.  It is not clear that this needs to be acutely evaluated at this time. -Continue PRN alprazolam  #) Status post right TKR: We will defer IV fluids and pain management to surgery at this time   Thank you for this consultation.  Our [facility] hospitalist team will follow the patient with you.   Time Spent: 25  Cristy Folks M.D. [facility] 01/21/2018, 8:48 AM

## 2018-01-21 NOTE — Progress Notes (Signed)
Echocardiogram 2D Echocardiogram has been performed.  Alexandria Chapman 01/21/2018, 2:17 PM

## 2018-01-21 NOTE — Plan of Care (Signed)
  Problem: Activity: Goal: Risk for activity intolerance will decrease Outcome: Progressing   Problem: Elimination: Goal: Will not experience complications related to bowel motility Outcome: Progressing Goal: Will not experience complications related to urinary retention Outcome: Progressing   Problem: Activity: Goal: Ability to avoid complications of mobility impairment will improve Outcome: Progressing   Carlynn Herald, RN 01/21/18 1:04 AM

## 2018-01-21 NOTE — Progress Notes (Signed)
Assisted patient to the restroom before sitting down she stated she felt dizzy, assisted her to sit on the toilet. She became pale and her eyes rolled back, she stopped responding. After a few seconds she was responding to questions and was able to grip RN's fingers with both hands. This happened a few more times. With assistance we were able to get patient back to bed via the wheel chair. Diaphoretic, placed cool cloth to patient's forehead, patient drank some water and returned back to baseline color once in bed.  BP 124/60 P 74  O2 Sat 99% Temp 97.7  Carlynn Herald, RN 01/21/18 06:50AM

## 2018-01-21 NOTE — Progress Notes (Signed)
    Subjective: 2 Days Post-Op Procedure(s) (LRB): RIGHT TOTAL KNEE ARTHROPLASTY (Right) Patient reports pain as 5 on 0-10 scale.   Denies CP or SOB.  Voiding without difficulty. Positive flatus. Pt had near syncope episode last night and this am. Pt reports feeling shaking right now.  Objective: Vital signs in last 24 hours: Temp:  [97.5 F (36.4 C)-98.2 F (36.8 C)] 97.7 F (36.5 C) (09/29 0655) Pulse Rate:  [55-72] 72 (09/29 0655) Resp:  [16] 16 (09/29 0655) BP: (110-169)/(50-77) 124/60 (09/29 0655) SpO2:  [95 %-100 %] 100 % (09/29 0655)  Intake/Output from previous day: 09/28 0701 - 09/29 0700 In: 1103.3 [P.O.:1080; I.V.:23.3] Out: 600 [Urine:600] Intake/Output this shift: No intake/output data recorded.  Labs: Recent Labs    01/20/18 0351 01/21/18 0403  HGB 11.9* 10.6*   Recent Labs    01/20/18 0351 01/21/18 0403  WBC 13.0* 12.1*  RBC 4.11 3.70*  HCT 36.0 32.2*  PLT 277 251   Recent Labs    01/20/18 0351  NA 135  K 4.3  CL 100  CO2 27  BUN 17  CREATININE 0.80  GLUCOSE 123*  CALCIUM 8.6*   No results for input(s): LABPT, INR in the last 72 hours.  Physical Exam: Neurologically intact ABD soft Sensation intact distally Dorsiflexion/Plantar flexion intact Incision: scant drainage Compartment soft Body mass index is 20.53 kg/m.   Assessment/Plan: 2 Days Post-Op Procedure(s) (LRB): RIGHT TOTAL KNEE ARTHROPLASTY (Right) Advance diet  Hold on PT CBC and vitals looks good Ordered UA, CMP and EKG Hold on DC right now  Analese Sovine, Darla Lesches for Dr. Melina Schools [facility] [phone] 01/21/2018, 8:09 AM

## 2018-01-22 ENCOUNTER — Encounter (HOSPITAL_COMMUNITY): Payer: Self-pay | Admitting: Specialist

## 2018-01-22 DIAGNOSIS — M1711 Unilateral primary osteoarthritis, right knee: Principal | ICD-10-CM

## 2018-01-22 LAB — CBC
HCT: 29.6 % — ABNORMAL LOW (ref 36.0–46.0)
Hemoglobin: 9.6 g/dL — ABNORMAL LOW (ref 12.0–15.0)
MCH: 28.4 pg (ref 26.0–34.0)
MCHC: 32.4 g/dL (ref 30.0–36.0)
MCV: 87.6 fL (ref 78.0–100.0)
PLATELETS: 241 10*3/uL (ref 150–400)
RBC: 3.38 MIL/uL — ABNORMAL LOW (ref 3.87–5.11)
RDW: 13 % (ref 11.5–15.5)
WBC: 9.2 10*3/uL (ref 4.0–10.5)

## 2018-01-22 NOTE — Progress Notes (Signed)
Subjective: 3 Days Post-Op Procedure(s) (LRB): RIGHT TOTAL KNEE ARTHROPLASTY (Right) Patient reports pain as well controlled.  Limited with PT. Tolerating PO's. Positive flatus. Medicine consult for near syncope. Echo completed yesterday. Denies Dizziness, CP, SOB, or calf pain.   Objective: Vital signs in last 24 hours: Temp:  [97.8 F (36.6 C)-98.8 F (37.1 C)] 98.1 F (36.7 C) (09/30 0428) Pulse Rate:  [63-80] 63 (09/30 0428) Resp:  [12-18] 16 (09/30 0428) BP: (122-167)/(59-82) 135/82 (09/30 0428) SpO2:  [95 %-100 %] 97 % (09/30 0428)  Intake/Output from previous day: 09/29 0701 - 09/30 0700 In: 1267.2 [P.O.:480; I.V.:787.2] Out: -  Intake/Output this shift: No intake/output data recorded.  Recent Labs    01/20/18 0351 01/21/18 0403 01/22/18 0521  HGB 11.9* 10.6* 9.6*   Recent Labs    01/21/18 0403 01/22/18 0521  WBC 12.1* 9.2  RBC 3.70* 3.38*  HCT 32.2* 29.6*  PLT 251 241   Recent Labs    01/20/18 0351 01/21/18 0828  NA 135 133*  K 4.3 3.7  CL 100 96*  CO2 27 27  BUN 17 17  CREATININE 0.80 0.77  GLUCOSE 123* 172*  CALCIUM 8.6* 8.8*   No results for input(s): LABPT, INR in the last 72 hours.  Well nourished. Alert and oriented x3. RRR, Lungs clear, BS x4. Abdomen soft and non tender. Right Calf soft and non tender. Right knee dressing C/D/I. No DVT signs. Compartment soft. No signs of infection.  Right LE grossly neurovascular intact.  Anticipated LOS equal to or greater than 2 midnights due to - Age 71 and older with one or more of the following:  - Obesity  - Expected need for hospital services (PT, OT, Nursing) required for safe  discharge  - Anticipated need for postoperative skilled nursing care or inpatient rehab  - Active co-morbidities: Anemia OR   - Unanticipated findings during/Post Surgery: None  - Patient is a high risk of re-admission due to: None   Assessment/Plan: 3 Days Post-Op Procedure(s) (LRB): RIGHT TOTAL KNEE ARTHROPLASTY  (Right) Up with therapy Discharge home with home health when ready Ok to D/c from ortho stand point. F/u in 2 weeks in office ASA Bid at D/c Follow instructions Medicine following Doing well this am    Tashira Torre L 01/22/2018, 7:38 AM

## 2018-01-22 NOTE — Progress Notes (Signed)
Physical Therapy Treatment Patient Details Name: Alexandria Chapman MRN: [id number] DOB: 1945/10/14 Today's Date: 01/22/2018    History of Present Illness Pt is a 72 YO female s/p R TKR on 01/19/18. PMH includes anxiety, colonic diverticulitis, abscess, R shoulder impingement, IBS, GERD, RLE squamous cell cancer removal, knee scope, shoulder scope.     PT Comments    Pt with 8/10 R knee pain. Pt states she has been in bed since yesterday give near syncope event, but she has been trying to do some RLE exercises. Pt with R knee stiffness today which was painful for pt, resolved after hallway ambulation distance and RLE exercises. Pt with improved R knee AAROM, less complaints of stiffness after session. Will continue to follow acutely. Pt with OPPT visit scheduled for tomorrow.    Follow Up Recommendations  Follow surgeon's recommendation for DC plan and follow-up therapies;Outpatient PT     Equipment Recommendations  None recommended by PT    Recommendations for Other Services       Precautions / Restrictions Precautions Precautions: Knee;Fall Restrictions Weight Bearing Restrictions: No Other Position/Activity Restrictions: WBAT     Mobility  Bed Mobility Overal bed mobility: Modified Independent             General bed mobility comments: Increased time and effort to get to EOB.   Transfers Overall transfer level: Needs assistance Equipment used: Rolling walker (2 wheeled) Transfers: Sit to/from Stand Sit to Stand: Min guard         General transfer comment: Min guard for safety, increased time to come to standing position secondary to R knee pain.   Ambulation/Gait Ambulation/Gait assistance: Supervision Gait Distance (Feet): 70 Feet Assistive device: Rolling walker (2 wheeled) Gait Pattern/deviations: Step-through pattern;Decreased stride length;Antalgic Gait velocity: decr    General Gait Details: Pt limited by pain during gait today. Supervision for safety.  After 35 ft ambulation, pt stating she wanted to go back to room due to severity of R knee pain. Upon return to room and sitting EOB, pt states "I feel green" meaning pt felt nauseous. Returned to supine positioning per pt request.   Stairs             Wheelchair Mobility    Modified Rankin (Stroke Patients Only)       Balance Overall balance assessment: Mild deficits observed, not formally tested                                          Cognition                                              Exercises Total Joint Exercises Quad Sets: AROM;Right;Supine;10 reps Heel Slides: AAROM;Right;10 reps;Supine Hip ABduction/ADduction: AROM;Right;10 reps;Supine Knee Flexion: AROM;Right;10 reps;Seated Goniometric ROM: 5-80* AAROM R knee extension/flexion     General Comments        Pertinent Vitals/Pain Pain Assessment: 0-10 Pain Score: 8  Pain Location: R knee, with mobility  Pain Descriptors / Indicators: Sore;Other (Comment)(stiffness) Pain Intervention(s): Limited activity within patient's tolerance;Premedicated before session;Monitored during session;Repositioned;Ice applied    Home Living                      Prior Function  PT Goals (current goals can now be found in the care plan section) Acute Rehab PT Goals PT Goal Formulation: With patient Time For Goal Achievement: 02/02/18 Potential to Achieve Goals: Good Progress towards PT goals: Progressing toward goals    Frequency    7X/week      PT Plan Current plan remains appropriate    Co-evaluation              AM-PAC PT "6 Clicks" Daily Activity  Outcome Measure  Difficulty turning over in bed (including adjusting bedclothes, sheets and blankets)?: None Difficulty moving from lying on back to sitting on the side of the bed? : None Difficulty sitting down on and standing up from a chair with arms (e.g., wheelchair, bedside commode,  etc,.)?: None Help needed moving to and from a bed to chair (including a wheelchair)?: None Help needed walking in hospital room?: A Little Help needed climbing 3-5 steps with a railing? : A Little 6 Click Score: 22    End of Session Equipment Utilized During Treatment: Gait belt Activity Tolerance: Patient tolerated treatment well Patient left: in bed;with bed alarm set;with call bell/phone within reach Nurse Communication: Mobility status PT Visit Diagnosis: Unsteadiness on feet (R26.81)     Time: 1131-1200 PT Time Calculation (min) (ACUTE ONLY): 29 min  Charges:  $Gait Training: 8-22 mins $Therapeutic Exercise: 8-22 mins                    Julien Girt, PT Acute Rehabilitation Services Pager [phone]  Office [phone]    [place] 01/22/2018, 12:52 PM

## 2018-01-22 NOTE — Plan of Care (Signed)
Pt unable to tolerate standing at bedside to finish ortho VS. Pt has not had a BM since 9/26.

## 2018-01-22 NOTE — Care Management Note (Signed)
Case Management Note  Patient Details  Name: Alexandria Chapman MRN: [id number] Date of Birth: May 31, 1945  Subjective/Objective: Patient followed by [facility] CM-Jill Lauer-Even though HHPT ordered,already set up w/otpt PT,already has dme.No CM needs.                   Action/Plan:dc home.   Expected Discharge Date:  01/19/18               Expected Discharge Plan:  OP Rehab  In-House Referral:  NA  Discharge planning Services  CM Consult  Post Acute Care Choice:  (has rw) Choice offered to:  Patient  DME Arranged:    DME Agency:     HH Arranged:  NA HH Agency:  NA  Status of Service:  Completed, signed off  If discussed at [place] of Stay Meetings, dates discussed:    Additional Comments:  Dessa Phi, RN 01/22/2018, 11:52 AM

## 2018-01-22 NOTE — Progress Notes (Signed)
PROGRESS NOTE    Alexandria Chapman  BJY:[id number] DOB: 1945-10-22 DOA: 01/19/2018 PCP: Nicholes Rough, PA-C    Brief Narrative:  72 y.o. female with past medical history relevant for anxiety, IBS, osteoarthritis who was admitted for right total knee replacement on 01/19/2018 and was pending discharge today.  Her postoperative course was fairly uneventful until yesterday when she began to notice diffuse tremulousness of unclear etiology.  She was not overly concerned about this.  Today while getting up to go to the bathroom she began to have a presyncopal episode from which was noted by the nurse who is helping her.  During this time she reported feeling "woozy" as well as feeling of warmth spreading over her face and felt her vision slightly dim.  She did not have any chest pain or palpitations during this time.  She recovered by being placed back in bed.  She otherwise reports only pain from her surgery site on the right knee.  She denies any nausea, vomiting, dumping, cough, congestion, orthopnea, PND, lower extremity edema.  She does report a history of a reportedly benign murmur but does not member any history of an echocardiogram.  She reports being quite active outside and reports ambulating significantly as part of her physical therapy and her enjoyment of hiking and reports no chest pain during this time.  She denies any family history of early cardiac disease.  Assessment & Plan:   Active Problems:   Osteoarthritis of right knee   S/P knee replacement  #) Presyncope:  - Most likely secondary to polypharmacy related to receiving multiple analgesic medications - Not orthostatic - Voiding well[ - Recommend avoiding over-medicating on sedating meds - Can d/c from medical standpoint  #) Anxiety/tremulousness:  - stable at this time  #) Status post right TKR: as per orthopedic service  Antimicrobials: Anti-infectives (From admission, onward)   Start     Dose/Rate Route Frequency Ordered  Stop   01/19/18 1630  ceFAZolin (ANCEF) IVPB 2g/100 mL premix     2 g 200 mL/hr over 30 Minutes Intravenous Every 6 hours 01/19/18 1408 01/19/18 2221   01/19/18 0730  ceFAZolin (ANCEF) IVPB 2g/100 mL premix     2 g 200 mL/hr over 30 Minutes Intravenous On call to O.R. 01/19/18 0723 01/19/18 1035       Subjective: Eager to go home  Objective: Vitals:   01/21/18 2214 01/21/18 2215 01/22/18 0016 01/22/18 0428  BP: (!) 122/59  (!) 142/63 135/82  Pulse: 80 80 65 63  Resp: 12  16 16   Temp:   98.2 F (36.8 C) 98.1 F (36.7 C)  TempSrc:   Oral Oral  SpO2:  100% 95% 97%  Weight:      Height:        Intake/Output Summary (Last 24 hours) at 01/22/2018 1154 Last data filed at 01/22/2018 0854 Gross per 24 hour  Intake 1387.19 ml  Output -  Net 1387.19 ml   Filed Weights   01/19/18 1511  Weight: 57.7 kg    Examination:  General exam: Appears calm and comfortable  Respiratory system: Clear to auscultation. Respiratory effort normal.  Data Reviewed: I have personally reviewed following labs and imaging studies  CBC: Recent Labs  Lab 01/20/18 0351 01/21/18 0403 01/22/18 0521  WBC 13.0* 12.1* 9.2  HGB 11.9* 10.6* 9.6*  HCT 36.0 32.2* 29.6*  MCV 87.6 87.0 87.6  PLT 277 251 086   Basic Metabolic Panel: Recent Labs  Lab 01/20/18 0351 01/21/18 0828  NA  135 133*  K 4.3 3.7  CL 100 96*  CO2 27 27  GLUCOSE 123* 172*  BUN 17 17  CREATININE 0.80 0.77  CALCIUM 8.6* 8.8*   GFR: Estimated Creatinine Clearance: 58.8 mL/min (by C-G formula based on SCr of 0.77 mg/dL). Liver Function Tests: Recent Labs  Lab 01/21/18 0828  AST 26  ALT 15  ALKPHOS 50  BILITOT 0.9  PROT 6.9  ALBUMIN 3.5   No results for input(s): LIPASE, AMYLASE in the last 168 hours. No results for input(s): AMMONIA in the last 168 hours. Coagulation Profile: No results for input(s): INR, PROTIME in the last 168 hours. Cardiac Enzymes: Recent Labs  Lab 01/21/18 0822 01/21/18 1511  01/21/18 2115  TROPONINI <0.03 <0.03 <0.03   BNP (last 3 results) No results for input(s): PROBNP in the last 8760 hours. HbA1C: No results for input(s): HGBA1C in the last 72 hours. CBG: No results for input(s): GLUCAP in the last 168 hours. Lipid Profile: No results for input(s): CHOL, HDL, LDLCALC, TRIG, CHOLHDL, LDLDIRECT in the last 72 hours. Thyroid Function Tests: No results for input(s): TSH, T4TOTAL, FREET4, T3FREE, THYROIDAB in the last 72 hours. Anemia Panel: No results for input(s): VITAMINB12, FOLATE, FERRITIN, TIBC, IRON, RETICCTPCT in the last 72 hours. Sepsis Labs: No results for input(s): PROCALCITON, LATICACIDVEN in the last 168 hours.  No results found for this or any previous visit (from the past 240 hour(s)).   Radiology Studies: No results found.  Scheduled Meds: . ALPRAZolam  0.25 mg Oral QHS  . chlorhexidine  60 mL Topical Once  . docusate sodium  100 mg Oral BID  . enoxaparin (LOVENOX) injection  30 mg Subcutaneous Q12H  . ferrous sulfate  325 mg Oral TID PC   Continuous Infusions: . lactated ringers 75 mL/hr at 01/22/18 1133     LOS: 3 days   [name], MD [facility] Pager On Amion  If 7PM-7AM, please contact night-coverage 01/22/2018, 11:54 AM

## 2018-01-22 NOTE — Discharge Summary (Signed)
Physician Discharge Summary  Patient ID: Alexandria Chapman MRN: [id number] DOB/AGE: 08-27-1945 72 y.o.  Admit date: 01/19/2018 Discharge date: 01/22/2018  Admission Diagnoses: Right knee OA  Discharge Diagnoses:  Active Problems:   Osteoarthritis of right knee   S/P knee replacement   Discharged Condition:good Hospital Course:  Alexandria Chapman is a 72 y.o. who was admitted to [facility]. They were brought to the operating room on 01/19/2018 and underwent Procedure(s): RIGHT TOTAL KNEE ARTHROPLASTY.  Patient tolerated the procedure well and was later transferred to the recovery room and then to the orthopaedic floor for postoperative care.  They were given PO and IV analgesics for pain control following their surgery.  They were given 24 hours of postoperative antibiotics of  Anti-infectives (From admission, onward)   Start     Dose/Rate Route Frequency Ordered Stop   01/19/18 1630  ceFAZolin (ANCEF) IVPB 2g/100 mL premix     2 g 200 mL/hr over 30 Minutes Intravenous Every 6 hours 01/19/18 1408 01/19/18 2221   01/19/18 0730  ceFAZolin (ANCEF) IVPB 2g/100 mL premix     2 g 200 mL/hr over 30 Minutes Intravenous On call to O.R. 01/19/18 5573 01/19/18 1035     and started on DVT prophylaxis in the form of lovenox.   PT and OT were ordered for total joint protocol.  Discharge planning consulted to help with postop disposition and equipment needs.  Patient had a good night on the evening of surgery and started to get up OOB with therapy on day one.  Hemovac drain was pulled without difficulty. Near syncope episode. Medicine was consulted and found it was related to post-op status. Continued to work with therapy into day two.  Dressing was with normal limits.  The patient had progressed with therapy and meeting their goals. Patient was seen in rounds and was ready to go home.  Consults: [facility]  Significant Diagnostic Studies: ECG Treatments:routine  Discharge Exam: Blood pressure  135/82, pulse 63, temperature 98.1 F (36.7 C), temperature source Oral, resp. rate 16, height 5\' 6"  (1.676 m), weight 57.7 kg, SpO2 97 %. Well nourished. Alert and oriented x3. RRR, Lungs clear, BS x4. Abdomen soft and non tender. Right Calf soft and non tender. Right knee dressing C/D/I. No DVT signs. Compartment soft. No signs of infection.  Right LE grossly neurovascular intact.  Disposition: Discharge disposition: 01-Home or Self Care       Discharge Instructions    Call MD / Call 911   Complete by:  As directed    If you experience chest pain or shortness of breath, CALL 911 and be transported to the hospital emergency room.  If you develope a fever above 101 F, pus (white drainage) or increased drainage or redness at the wound, or calf pain, call your surgeon's office.   Constipation Prevention   Complete by:  As directed    Drink plenty of fluids.  Prune juice may be helpful.  You may use a stool softener, such as Colace (over the counter) 100 mg twice a day.  Use MiraLax (over the counter) for constipation as needed.   Diet - low sodium heart healthy   Complete by:  As directed    Discharge instructions   Complete by:  As directed    INSTRUCTIONS AFTER JOINT REPLACEMENT   Remove items at home which could result in a fall. This includes throw rugs or furniture in walking pathways ICE to the affected joint every three hours while awake  for 30 minutes at a time, for at least the first 3-5 days, and then as needed for pain and swelling.  Continue to use ice for pain and swelling. You may notice swelling that will progress down to the foot and ankle.  This is normal after surgery.  Elevate your leg when you are not up walking on it.   Continue to use the breathing machine you got in the hospital (incentive spirometer) which will help keep your temperature down.  It is common for your temperature to cycle up and down following surgery, especially at night when you are not up moving  around and exerting yourself.  The breathing machine keeps your lungs expanded and your temperature down.   DIET:  As you were doing prior to hospitalization, we recommend a well-balanced diet.  DRESSING / WOUND CARE / SHOWERING  Keep the surgical dressing until follow up.  The dressing is water proof, so you can shower without any extra covering.  IF THE DRESSING FALLS OFF or the wound gets wet inside, change the dressing with sterile gauze.  Please use good hand washing techniques before changing the dressing.  Do not use any lotions or creams on the incision until instructed by your surgeon.    ACTIVITY  Increase activity slowly as tolerated, but follow the weight bearing instructions below.   No driving for 6 weeks or until further direction given by your physician.  You cannot drive while taking narcotics.  No lifting or carrying greater than 10 lbs. until further directed by your surgeon. Avoid periods of inactivity such as sitting longer than an hour when not asleep. This helps prevent blood clots.  You may return to work once you are authorized by your doctor.     WEIGHT BEARING   Weight bearing as tolerated with assist device (walker, cane, etc) as directed, use it as long as suggested by your surgeon or therapist, typically at least 4-6 weeks.   EXERCISES  Results after joint replacement surgery are often greatly improved when you follow the exercise, range of motion and muscle strengthening exercises prescribed by your doctor. Safety measures are also important to protect the joint from further injury. Any time any of these exercises cause you to have increased pain or swelling, decrease what you are doing until you are comfortable again and then slowly increase them. If you have problems or questions, call your caregiver or physical therapist for advice.   Rehabilitation is important following a joint replacement. After just a few days of immobilization, the muscles of the leg  can become weakened and shrink (atrophy).  These exercises are designed to build up the tone and strength of the thigh and leg muscles and to improve motion. Often times heat used for twenty to thirty minutes before working out will loosen up your tissues and help with improving the range of motion but do not use heat for the first two weeks following surgery (sometimes heat can increase post-operative swelling).   These exercises can be done on a training (exercise) mat, on the floor, on a table or on a bed. Use whatever works the best and is most comfortable for you.    Use music or television while you are exercising so that the exercises are a pleasant break in your day. This will make your life better with the exercises acting as a break in your routine that you can look forward to.   Perform all exercises about fifteen times, three times per  day or as directed.  You should exercise both the operative leg and the other leg as well.   Exercises include:   Quad Sets - Tighten up the muscle on the front of the thigh (Quad) and hold for 5-10 seconds.   Straight Leg Raises - With your knee straight (if you were given a brace, keep it on), lift the leg to 60 degrees, hold for 3 seconds, and slowly lower the leg.  Perform this exercise against resistance later as your leg gets stronger.  Leg Slides: Lying on your back, slowly slide your foot toward your buttocks, bending your knee up off the floor (only go as far as is comfortable). Then slowly slide your foot back down until your leg is flat on the floor again.  Angel Wings: Lying on your back spread your legs to the side as far apart as you can without causing discomfort.  Hamstring Strength:  Lying on your back, push your heel against the floor with your leg straight by tightening up the muscles of your buttocks.  Repeat, but this time bend your knee to a comfortable angle, and push your heel against the floor.  You may put a pillow under the heel to make  it more comfortable if necessary.   A rehabilitation program following joint replacement surgery can speed recovery and prevent re-injury in the future due to weakened muscles. Contact your doctor or a physical therapist for more information on knee rehabilitation.    CONSTIPATION  Constipation is defined medically as fewer than three stools per week and severe constipation as less than one stool per week.  Even if you have a regular bowel pattern at home, your normal regimen is likely to be disrupted due to multiple reasons following surgery.  Combination of anesthesia, postoperative narcotics, change in appetite and fluid intake all can affect your bowels.   YOU MUST use at least one of the following options; they are listed in order of increasing strength to get the job done.  They are all available over the counter, and you may need to use some, POSSIBLY even all of these options:    Drink plenty of fluids (prune juice may be helpful) and high fiber foods Colace 100 mg by mouth twice a day  Senokot for constipation as directed and as needed Dulcolax (bisacodyl), take with full glass of water  Miralax (polyethylene glycol) once or twice a day as needed.  If you have tried all these things and are unable to have a bowel movement in the first 3-4 days after surgery call either your surgeon or your primary doctor.    If you experience loose stools or diarrhea, hold the medications until you stool forms back up.  If your symptoms do not get better within 1 week or if they get worse, check with your doctor.  If you experience "the worst abdominal pain ever" or develop nausea or vomiting, please contact the office immediately for further recommendations for treatment.   ITCHING:  If you experience itching with your medications, try taking only a single pain pill, or even half a pain pill at a time.  You can also use Benadryl over the counter for itching or also to help with sleep.   TED HOSE  STOCKINGS:  Use stockings on both legs until for at least 2 weeks or as directed by physician office. They may be removed at night for sleeping.  MEDICATIONS:  See your medication summary on the "After Visit Summary"  that nursing will review with you.  You may have some home medications which will be placed on hold until you complete the course of blood thinner medication.  It is important for you to complete the blood thinner medication as prescribed.  PRECAUTIONS:  If you experience chest pain or shortness of breath - call 911 immediately for transfer to the hospital emergency department.   If you develop a fever greater that 101 F, purulent drainage from wound, increased redness or drainage from wound, foul odor from the wound/dressing, or calf pain - CONTACT YOUR SURGEON.                                                   FOLLOW-UP APPOINTMENTS:  If you do not already have a post-op appointment, please call the office for an appointment to be seen by your surgeon.  Guidelines for how soon to be seen are listed in your "After Visit Summary", but are typically between 1-4 weeks after surgery.  OTHER INSTRUCTIONS:   Knee Replacement:  Do not place pillow under knee, focus on keeping the knee straight while resting. CPM instructions: 0-90 degrees, 2 hours in the morning, 2 hours in the afternoon, and 2 hours in the evening. Place foam block, curve side up under heel at all times except when in CPM or when walking.  DO NOT modify, tear, cut, or change the foam block in any way.  MAKE SURE YOU:  Understand these instructions.  Get help right away if you are not doing well or get worse.    Thank you for letting us be a part of your medical care team.  It is a privilege we respect greatly.  We hope these instructions will help you stay on track for a fast and full recovery!   Increase activity slowly as tolerated   Complete by:  As directed      Allergies as of 01/22/2018      Reactions   Sulfa  Antibiotics Anaphylaxis   Erythromycin    Bloody stool   Lexapro [escitalopram Oxalate]    "spacey" flu like symptoms    Nsaids    Bloody stool, abdominal pain   Pantoprazole Sodium Nausea Only   Sucralfate    Unknown reaction   Voltaren [diclofenac Sodium] Other (See Comments)   Bloody stool, abdominal pain   Sudafed [pseudoephedrine Hcl] Palpitations, Other (See Comments)   Dizziness, fainting       Medication List    STOP taking these medications   amoxicillin-clavulanate 875-125 MG tablet Commonly known as:  AUGMENTIN     TAKE these medications   ALIGN PO Take 1 capsule by mouth daily.   ALPRAZolam 0.25 MG tablet Commonly known as:  XANAX Take 1 tablet (0.25 mg total) by mouth at bedtime. What changed:  Another medication with the same name was removed. Continue taking this medication, and follow the directions you see here.   baclofen 10 MG tablet Commonly known as:  LIORESAL Take 1 tablet (10 mg total) by mouth daily.   CAL-MAG-ZINC-D PO Take 3 tablets by mouth daily.   cholecalciferol 1000 units tablet Commonly known as:  VITAMIN D Take 1,000 Units by mouth daily.   COD LIVER OIL PO Take 1 capsule by mouth daily.   COQ10-VITAMIN E PO Take 1 tablet by mouth daily.   dicyclomine  20 MG tablet Commonly known as:  BENTYL Take 20 mg by mouth 2 (two) times daily as needed for spasms.   escitalopram 5 MG tablet Commonly known as:  LEXAPRO Take 1 tablet (5 mg total) by mouth daily.   famotidine 20 MG tablet Commonly known as:  PEPCID Take 20 mg by mouth daily as needed for heartburn or indigestion.   guaiFENesin 600 MG 12 hr tablet Commonly known as:  MUCINEX Take 600 mg by mouth 2 (two) times daily as needed (congestion).   multivitamin with minerals Tabs tablet Take 2 tablets by mouth daily.   OMEGA 3 500 PO Take 500 mg by mouth daily.   oxyCODONE 5 MG immediate release tablet Commonly known as:  Oxy IR/ROXICODONE Take 1 tablet (5 mg total) by  mouth every 4 (four) hours as needed.   polyethylene glycol packet Commonly known as:  MIRALAX / GLYCOLAX Take 17 g by mouth 2 (two) times daily. What changed:  when to take this   rivaroxaban 10 MG Tabs tablet Commonly known as:  XARELTO Take 1 tablet (10 mg total) by mouth daily.   Selenium 200 MCG Caps Take 200 mcg by mouth daily.   TURMERIC PO Take 900 mg by mouth daily.   vitamin B-12 1000 MCG tablet Commonly known as:  CYANOCOBALAMIN Take 1,000 mcg by mouth daily.        SignedLajean Manes 01/22/2018, 12:15 PM

## 2018-03-05 ENCOUNTER — Telehealth: Payer: Self-pay | Admitting: Gastroenterology

## 2018-03-05 NOTE — Telephone Encounter (Signed)
Patient states she was referred to Dr.Stark by her neighbors; the [facility]. Patient states she has had black tar like stool for town days and wants to see Dr.Stark due to her previous gi being out of town and retiring. Patient previously seen Dr.Sears at [facility] this year. Records are in Care everywhere for review. Please review and advise on scheduling.

## 2018-03-05 NOTE — Telephone Encounter (Signed)
OK to accept patient in transfer for a regular new patient evaluation. If she needs a sooner appt then she can establish with our other physicians or see one of our APPs.

## 2018-03-06 NOTE — Telephone Encounter (Signed)
Notified patient of Dr.Stark's recommendations. Patient scheduled with APP Amy for sooner appt.

## 2018-03-08 ENCOUNTER — Other Ambulatory Visit: Payer: Self-pay | Admitting: *Deleted

## 2018-03-08 ENCOUNTER — Encounter: Payer: Self-pay | Admitting: Physician Assistant

## 2018-03-08 ENCOUNTER — Other Ambulatory Visit (INDEPENDENT_AMBULATORY_CARE_PROVIDER_SITE_OTHER): Payer: MEDICARE

## 2018-03-08 ENCOUNTER — Ambulatory Visit (INDEPENDENT_AMBULATORY_CARE_PROVIDER_SITE_OTHER): Payer: MEDICARE | Admitting: Physician Assistant

## 2018-03-08 VITALS — BP 104/68 | HR 66 | Ht 66.0 in | Wt 123.0 lb

## 2018-03-08 DIAGNOSIS — R110 Nausea: Secondary | ICD-10-CM | POA: Diagnosis not present

## 2018-03-08 DIAGNOSIS — Z860101 Personal history of adenomatous and serrated colon polyps: Secondary | ICD-10-CM

## 2018-03-08 DIAGNOSIS — K5730 Diverticulosis of large intestine without perforation or abscess without bleeding: Secondary | ICD-10-CM

## 2018-03-08 DIAGNOSIS — R630 Anorexia: Secondary | ICD-10-CM

## 2018-03-08 DIAGNOSIS — Z86010 Personal history of colonic polyps: Secondary | ICD-10-CM

## 2018-03-08 DIAGNOSIS — R195 Other fecal abnormalities: Secondary | ICD-10-CM

## 2018-03-08 LAB — CBC WITH DIFFERENTIAL/PLATELET
BASOS PCT: 0.4 % (ref 0.0–3.0)
Basophils Absolute: 0 10*3/uL (ref 0.0–0.1)
EOS PCT: 0.9 % (ref 0.0–5.0)
Eosinophils Absolute: 0.1 10*3/uL (ref 0.0–0.7)
HCT: 37.5 % (ref 36.0–46.0)
Hemoglobin: 12.4 g/dL (ref 12.0–15.0)
LYMPHS ABS: 1.4 10*3/uL (ref 0.7–4.0)
Lymphocytes Relative: 21.7 % (ref 12.0–46.0)
MCHC: 33.1 g/dL (ref 30.0–36.0)
MCV: 86.2 fl (ref 78.0–100.0)
MONO ABS: 0.6 10*3/uL (ref 0.1–1.0)
Monocytes Relative: 10.1 % (ref 3.0–12.0)
NEUTROS PCT: 66.9 % (ref 43.0–77.0)
Neutro Abs: 4.2 10*3/uL (ref 1.4–7.7)
Platelets: 300 10*3/uL (ref 150.0–400.0)
RBC: 4.35 Mil/uL (ref 3.87–5.11)
RDW: 13 % (ref 11.5–15.5)
WBC: 6.2 10*3/uL (ref 4.0–10.5)

## 2018-03-08 LAB — COMPREHENSIVE METABOLIC PANEL
ALK PHOS: 68 U/L (ref 39–117)
ALT: 9 U/L (ref 0–35)
AST: 17 U/L (ref 0–37)
Albumin: 4.1 g/dL (ref 3.5–5.2)
BUN: 10 mg/dL (ref 6–23)
CO2: 29 mEq/L (ref 19–32)
Calcium: 9.8 mg/dL (ref 8.4–10.5)
Chloride: 97 mEq/L (ref 96–112)
Creatinine, Ser: 0.73 mg/dL (ref 0.40–1.20)
GFR: 83.46 mL/min (ref >60.00)
GLUCOSE: 90 mg/dL (ref 70–99)
POTASSIUM: 3.6 meq/L (ref 3.5–5.1)
SODIUM: 134 meq/L — AB (ref 135–145)
Total Bilirubin: 0.4 mg/dL (ref 0.2–1.2)
Total Protein: 8 g/dL (ref 6.0–8.3)

## 2018-03-08 MED ORDER — ONDANSETRON HCL 4 MG PO TABS: ORAL_TABLET | ORAL | 1 refills | Status: DC

## 2018-03-08 MED ORDER — DICYCLOMINE HCL 10 MG PO CAPS: ORAL_CAPSULE | ORAL | 8 refills | Status: DC

## 2018-03-08 MED ORDER — PANTOPRAZOLE SODIUM 20 MG PO TBEC: DELAYED_RELEASE_TABLET | ORAL | 8 refills | Status: DC

## 2018-03-08 NOTE — Patient Instructions (Addendum)
Your provider has requested that you go to the basement level for lab work before leaving today. Press "B" on the elevator. The lab is located at the first door on the left as you exit the elevator. We sent prescriptions to [facility] for you to pick up at your convenience.  1. Dicyclomine ( Bentyl) 10 mg 2. Zofran ( Ondansetron) 4 mg 3. Pantoproazole sodium 20 mg.   Call and speak to Dr. Lynne Leader nurse if dark stool comes back.   You have been scheduled for an endoscopy. Please follow written instructions given to you at your visit today. If you use inhalers (even only as needed), please bring them with you on the day of your procedure.   Normal BMI (Body Mass Index- based on height and weight) is between 23 and 30. Your BMI today is Body mass index is 19.85 kg/m. Marland Kitchen Please consider follow up  regarding your BMI with your Primary Care Provider.

## 2018-03-08 NOTE — Progress Notes (Signed)
Subjective:    Patient ID: Darcelle Herrada, female    DOB: 17-Mar-1946, 72 y.o.   MRN: [id number]  HPI Jimmi is a pleasant 72 year old white female, new to GI today who had been accepted by Dr. Fuller Plan.  She had previously been seeing Dr. Erlene Quan in [place] who is retiring. She called for acute appointment because of concerns with black stools this past weekend.  She states that she had a very dark tarry appearing bowel movement x2 on 03/04/2018.  The following day she had one dark blackish stool and then bowel movements since have appeared normal. She has been exhausted recently because she still having difficulty sleeping due to discomfort from her knee which she had replaced at the end of September 2019.  She says her appetite has been "horrible" ever since surgery and her weight is down about 7 pounds total.  She has noticed increased belching and gas.  Denies any heartburn or dysphasia.  She has been nauseated very frequently but not vomiting.  She denies any use of Pepto-Bismol, no recent aspirin or NSAIDs. She had been on Lovenox while she was in the hospital post knee replacement and was sent home with Xarelto which she just took for [place] a few days and then was switched back to Lovenox.  She has not been on any blood thinner now over the past 1 month. She has no prior history of GI bleeding. She did have an old prescription for pantoprazole 20 mg and started herself back on that earlier this week.  Patient is status post appendectomy and cholecystectomy, has history of GERD but had not been requiring any regular medication.  She says she did have a prior EGD in 2007 which was done in [place], and last colonoscopy was done in 2015 also in [place] for history of adenomatous colon polyps.  She does not remember whether she had polyps removed at that time but was told to have a 5-year interval follow-up. She also has history of IBS, and had an episode of complicated diverticulitis in July  2019.  She had a microperforation and abscess and was hospitalized briefly.  Symptoms resolved, and she was referred to a general surgeon by Dr. Erlene Quan for consideration of sigmoid colectomy but apparently that was not felt necessary in her situation.  To her knowledge this was the only episode of diverticulitis she has had.  Review of Systems Pertinent positive and negative review of systems were noted in the above HPI section.  All other review of systems was otherwise negative.  Outpatient Encounter Medications as of 03/08/2018  Medication Sig  . ALPRAZolam (XANAX) 0.25 MG tablet Take 1 tablet (0.25 mg total) by mouth at bedtime.  . baclofen (LIORESAL) 10 MG tablet Take 1 tablet (10 mg total) by mouth daily.  . cholecalciferol (VITAMIN D) 1000 units tablet Take 1,000 Units by mouth daily.  . COD LIVER OIL PO Take 1 capsule by mouth daily.   . Coenzyme Q10-Vitamin E (COQ10-VITAMIN E PO) Take 1 tablet by mouth daily.  Marland Kitchen dicyclomine (BENTYL) 20 MG tablet Take 20 mg by mouth 2 (two) times daily as needed for spasms.   Marland Kitchen guaiFENesin (MUCINEX) 600 MG 12 hr tablet Take 600 mg by mouth 2 (two) times daily as needed (congestion).  . Multiple Minerals-Vitamins (CAL-MAG-ZINC-D PO) Take 3 tablets by mouth daily.  . Multiple Vitamin (MULTIVITAMIN WITH MINERALS) TABS tablet Take 2 tablets by mouth daily.  . Omega-3 Fatty Acids (OMEGA 3 500 PO) Take  500 mg by mouth daily.  Marland Kitchen oxyCODONE (ROXICODONE) 5 MG immediate release tablet Take 1 tablet (5 mg total) by mouth every 4 (four) hours as needed.  . polyethylene glycol (MIRALAX / GLYCOLAX) packet Take 17 g by mouth 2 (two) times daily. (Patient taking differently: Take 17 g by mouth every other day. )  . Probiotic Product (ALIGN PO) Take 1 capsule by mouth daily.   . Selenium 200 MCG CAPS Take 200 mcg by mouth daily.   . TURMERIC PO Take 900 mg by mouth daily.  . vitamin B-12 (CYANOCOBALAMIN) 1000 MCG tablet Take 1,000 mcg by mouth daily.  Marland Kitchen dicyclomine  (BENTYL) 10 MG capsule Take 1 tablet by mouth every 6 hours as needed for cramping and IBS.  Marland Kitchen ondansetron (ZOFRAN) 4 MG tablet Take 1 tablet by mouth twice daily as needed for nausea.  . pantoprazole (PROTONIX) 20 MG tablet Take 1 tablet by mouth every morning.  . [DISCONTINUED] escitalopram (LEXAPRO) 5 MG tablet Take 1 tablet (5 mg total) by mouth daily. (Patient not taking: Reported on 01/08/2018)  . [DISCONTINUED] famotidine (PEPCID) 20 MG tablet Take 20 mg by mouth daily as needed for heartburn or indigestion.  . [DISCONTINUED] rivaroxaban (XARELTO) 10 MG TABS tablet Take 1 tablet (10 mg total) by mouth daily. (Patient not taking: Reported on 03/08/2018)   No facility-administered encounter medications on file as of 03/08/2018.    Allergies  Allergen Reactions  . Sulfa Antibiotics Anaphylaxis  . Erythromycin     Bloody stool  . Lexapro [Escitalopram Oxalate]     "spacey" flu like symptoms   . Nsaids     Bloody stool, abdominal pain  . Pantoprazole Sodium Nausea Only  . Sucralfate     Unknown reaction  . Voltaren [Diclofenac Sodium] Other (See Comments)    Bloody stool, abdominal pain  . Sudafed [Pseudoephedrine Hcl] Palpitations and Other (See Comments)    Dizziness, fainting    Patient Active Problem List   Diagnosis Date Noted  . Osteoarthritis of right knee 01/19/2018  . S/P knee replacement 01/19/2018  . Insomnia 11/04/2017  . Anxiety   . Colonic diverticular abscess 11/01/2017  . Diverticulitis 10/29/2017  . Impingement syndrome of right shoulder region 06/16/2017  . Irritable bowel syndrome with constipation 02/15/2017  . Gastroesophageal reflux disease 11/10/2016  . Diverticulosis of large intestine without hemorrhage 10/14/2015  . 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 Generalized anxiety disorder 05/08/2015  . 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 Marital status: Widowed    Spouse name: Not on file  . Number of children:  Not on file  . Years of education: Not on file  . Highest education level: Not on file  Occupational History  . Not on file  Social Needs  . Financial resource strain: Not on file  . Food insecurity:    Worry: Not on file    Inability: Not on file  . Transportation needs:    Medical: Not on file    Non-medical: Not on file  Tobacco Use  . Smoking status: Former Smoker    Years: 2.00  . Smokeless tobacco: Never Used  . Tobacco comment: Few cigarettes during her 'teen' years  Substance and Sexual Activity  . Alcohol use: Yes    Comment: occas  . Drug use: No  . Sexual activity: Never  Lifestyle  . Physical activity:    Days per week: Not on file    Minutes per session: Not  on file  . Stress: Not on file  Relationships  . Social connections:    Talks on phone: Not on file    Gets together: Not on file    Attends religious service: Not on file    Active member of club or organization: Not on file    Attends meetings of clubs or organizations: Not on file    Relationship status: Not on file  . Intimate partner violence:    Fear of current or ex partner: Not on file    Emotionally abused: Not on file    Physically abused: Not on file    Forced sexual activity: Not on file  Other Topics Concern  . Not on file  Social History Narrative  . Not on file    Ms. Bolotin's family history includes Alcohol abuse in her maternal uncle and mother; Bipolar disorder in her mother; Breast cancer in her cousin, mother, and paternal grandmother; Cancer in her father; Diabetes in her father; Drug abuse in her cousin; Heart disease in her father and mother; OCD in her other; Prostate cancer in her father; Uterine cancer in her mother.      Objective:    Vitals:   03/08/18 1007  BP: 104/68  Pulse: 66    Physical Exam; well-developed older white female in no acute distress, pleasant blood pressure 104/68 pulse 66, height 5 foot 6, weight 123, BMI 19.8.  HEENT; nontraumatic  normocephalic EOMI PERRLA sclera anicteric oral mucosa moist, Cardiovascular ;regular rate and rhythm with S1-S2 no murmur rub or gallop, Pulmonary; clear bilaterally, Abdomen ;soft, nontender, no palpable mass or hepatosplenomegaly bowel sounds are present, Rectal ;exam dark brown stool heme-negative.  Extremities ;no clubbing cyanosis or edema skin warm and dry, cap Neuro psych; alert and oriented, grossly nonfocal mood and affect appropriate       Assessment & Plan:   #44 72 year old white female with complaint of melena/black stool x2 days which occurred about 5 days ago.  Stools now normal, and heme-negative on exam today.  She certainly may have had an episode of melena which has resolved.  Risk factors for GI bleeding would include recent surgery/knee replacement, and recent use of anticoagulation. Rule out peptic ulcer disease or gastropathy  #2 nausea-again present since knee surgery likely multifactoral-rule out gastritis or peptic ulcer disease #3 status post cholecystectomy and appendectomy #4 history of complicated diverticulitis with abscess July 2019  ([place]) #5 history of adenomatous colon polyps-last colonoscopy done in [place] 2015 was recommended to have interval follow-up in 5 years #6 GERD-not been requiring any regular medication #7 history of IBS  Plan; CBC with differential, C met Protonix 20 mg p.o. every morning, prescription refill sent.  Patient says she cannot tolerate 40 mg Protonix without nausea Avoid aspirin and NSAIDs  Zofran 4 mg p.o. twice daily PRN for nausea Also refilled Bentyl 10 mg 1 p.o. every 6 hours as needed for IBS symptoms. Patient was scheduled for upper endoscopy with Dr. Fuller Plan.  Procedure was discussed in detail with the patient including indications risks and benefits and she is agreeable to proceed.   Patient says she has her prior endoscopic records at home and will bring them for review and scanned into epic.  She will need to  be placed for recall colonoscopy for mid 2020. Patient was advised to call for advice should she have any recurrence of black tarry stool.  Jehan Bonano S Tyreanna Bisesi PA-C 03/08/2018   Cc: Nicholes Rough, PA-C

## 2018-03-10 LAB — H PYLORI, IGM, IGG, IGA AB
H PYLORI IGG: 0.36 {index_val} (ref 0.00–0.79)
H pylori, IgM Abs: <9 units (ref 0.0–8.9)
H. pylori, IgA Abs: 20.9 units — ABNORMAL HIGH (ref 0.0–8.9)

## 2018-03-11 NOTE — Progress Notes (Signed)
Reviewed and agree with management plan.  Buell Parcel T. Olis Viverette, MD FACG 

## 2018-03-14 ENCOUNTER — Telehealth: Payer: Self-pay | Admitting: Physician Assistant

## 2018-03-14 NOTE — Telephone Encounter (Signed)
Highly unlikely Protonix related. Please have her contact her PCP to further evaluate.

## 2018-03-14 NOTE — Telephone Encounter (Signed)
Patient states that 10 minutes before she has a bm she starts to get weak and feeling faint, it has been happening after she started taking protonix.

## 2018-03-14 NOTE — Telephone Encounter (Signed)
Spoke to the patient.  She is reporting she has had a "vasovageal like sensation" for the past 4 days which occurs when she feels the urge to go have a bowel movement. She will sit still, wait for it to pass and then she can safely get up and go to the bathroom.No other symptoms for the remainder of the day. She is concerned it is connected to the Protonix. Please advise.

## 2018-03-14 NOTE — Telephone Encounter (Signed)
Patient is advised. She says she will consider this very seriously however she just was "throughly examined" prior to her knee surgery. Encouraged to reconsider calling her PCP as this is new and not likely related to the Protonix.

## 2018-03-20 NOTE — Progress Notes (Signed)
Reviewed and agree with management plan.  Yusif Gnau T. Kalsey Lull, MD FACG 

## 2018-04-16 ENCOUNTER — Encounter: Payer: Self-pay | Admitting: Gastroenterology

## 2018-04-19 ENCOUNTER — Telehealth: Payer: Self-pay | Admitting: Physician Assistant

## 2018-04-19 NOTE — Telephone Encounter (Signed)
Please see the labs from 03/08/18. Patient has not been informed of the results. Patient had cancelled the EGD. She was feeling better and the timing of the procedure was not good for her. She had family coming in for the holidays.   She reports she has a definite improvement in her overall symptoms. She has gained a "few pounds." She has complaints of an Ache in the upper epigastric area and continuing constipation.

## 2018-04-19 NOTE — Telephone Encounter (Signed)
Patient states she just received a mychart message that states her lab results from 11.14.19 labs show Hpylori. Pt states she was never told her lab results or given medication for hpylori. Patient would like to discuss further with the nurse.

## 2018-04-23 NOTE — Telephone Encounter (Signed)
Beth,please call pt - I apologize about her not getting lab results called to her - They were not in my in box , not sure why ... at any rate  The Hpylori serology showed only hpylori IGA Ab +, the  IGG and IGm were negative so does not indicate an active infection , just exposure .. I'm glad she is feeling better .   We could do Hpylori stool Ag   to be sure no active infection, if she would like to pursue.

## 2018-04-23 NOTE — Telephone Encounter (Signed)
No answer. Line went dead. No voicemail.

## 2018-05-03 ENCOUNTER — Encounter: Payer: Self-pay | Admitting: Physician Assistant

## 2018-05-03 ENCOUNTER — Other Ambulatory Visit (INDEPENDENT_AMBULATORY_CARE_PROVIDER_SITE_OTHER): Payer: MEDICARE

## 2018-05-03 ENCOUNTER — Ambulatory Visit (INDEPENDENT_AMBULATORY_CARE_PROVIDER_SITE_OTHER): Payer: MEDICARE | Admitting: Physician Assistant

## 2018-05-03 VITALS — BP 122/70 | HR 55 | Ht 65.0 in | Wt 127.1 lb

## 2018-05-03 DIAGNOSIS — R1032 Left lower quadrant pain: Secondary | ICD-10-CM | POA: Diagnosis not present

## 2018-05-03 DIAGNOSIS — K5732 Diverticulitis of large intestine without perforation or abscess without bleeding: Secondary | ICD-10-CM

## 2018-05-03 LAB — CBC WITH DIFFERENTIAL/PLATELET
BASOS ABS: 0.1 10*3/uL (ref 0.0–0.1)
BASOS PCT: 0.9 % (ref 0.0–3.0)
Eosinophils Absolute: 0.1 10*3/uL (ref 0.0–0.7)
Eosinophils Relative: 1.8 % (ref 0.0–5.0)
HCT: 38 % (ref 36.0–46.0)
HEMOGLOBIN: 12.5 g/dL (ref 12.0–15.0)
LYMPHS PCT: 23.4 % (ref 12.0–46.0)
Lymphs Abs: 1.6 10*3/uL (ref 0.7–4.0)
MCHC: 32.8 g/dL (ref 30.0–36.0)
MCV: 82.8 fl (ref 78.0–100.0)
Monocytes Absolute: 0.7 10*3/uL (ref 0.1–1.0)
Monocytes Relative: 11.1 % (ref 3.0–12.0)
NEUTROS ABS: 4.2 10*3/uL (ref 1.4–7.7)
Neutrophils Relative %: 62.8 % (ref 43.0–77.0)
Platelets: 261 10*3/uL (ref 150.0–400.0)
RBC: 4.59 Mil/uL (ref 3.87–5.11)
RDW: 13.1 % (ref 11.5–15.5)
WBC: 6.8 10*3/uL (ref 4.0–10.5)

## 2018-05-03 MED ORDER — CIPROFLOXACIN HCL 500 MG PO TABS: 500.0000 mg | ORAL_TABLET | Freq: Two times a day (BID) | ORAL | 0 refills | Status: DC

## 2018-05-03 MED ORDER — METRONIDAZOLE 500 MG PO TABS: 500.0000 mg | ORAL_TABLET | Freq: Two times a day (BID) | ORAL | 0 refills | Status: DC

## 2018-05-03 NOTE — Patient Instructions (Signed)
Your provider has requested that you go to the basement level for lab work before leaving today. Press "B" on the elevator. The lab is located at the first door on the left as you exit the elevator.  [facility] [address], [place], [place]. 1. Ciprofloxin 500 mg 2. Metronidazole ( Flagyl ) 500 mg  Use Stool softener and Miralax- 1 dose 17 grams in 8 oz of water as needed.  Gradually advance your diet.  Call us in a week if symptoms are persisting.  Normal BMI (Body Mass Index- based on height and weight) is between 23 and 30. Your BMI today is Body mass index is 21.15 kg/m. Marland Kitchen Please consider follow up  regarding your BMI with your Primary Care Provider.

## 2018-05-03 NOTE — Progress Notes (Signed)
Subjective:    Patient ID: Alexandria Chapman, female    DOB: 10/26/1945, 73 y.o.   MRN: [id number]  HPI Alexandria Chapman is a pleasant 73 year old female, established with Dr. Fuller Plan who was seen by myself in November 2019 as a new patient.  At that time she was concerned about dark stools, but was Hemoccult negative.  She had undergone a knee replacement in September 2019. Patient also has history of complicated diverticulitis with hospitalization in July 2019 with diverticular abscess which was small and managed conservatively.  She was seen by the surgery team in the hospital and not GI. He comes in today with complaints of lower abdominal pain over the past 5 days she says feels similar to prior diverticulitis though not as severe.  He has not had any fever or chills, initially she had some mild nausea which has resolved.  She has been eating very light over the past few days has not noticed any increase in pain with p.o. intake.  She describes a constant ache in her lower abdomen.  She also feels increased pressure when her bladder is full but not has not been having any dysuria or sense of incomplete bladder emptying. She had been having some constipation earlier in the week but does not recall whether she was constipated prior to onset of pain.  She actually tried to manually remove some stool a couple of days ago.  Then yesterday she had loose stools several times without blood.  No bowel movement today.  Patient is status post appendectomy and cholecystectomy. Her colonoscopy had been done in [place] in 2015.  We had obtained a copy of this report which is just the pictures of the report and not a written summary.  There was diverticulosis noted, no polyps.  To her knowledge she has not had prior history of polyps.  Review of Systems Pertinent positive and negative review of systems were noted in the above HPI section.  All other review of systems was otherwise negative.  Outpatient Encounter  Medications as of 05/03/2018  Medication Sig  . ALPRAZolam (XANAX) 0.25 MG tablet Take 1 tablet (0.25 mg total) by mouth at bedtime.  . baclofen (LIORESAL) 10 MG tablet Take 1 tablet (10 mg total) by mouth daily.  . cholecalciferol (VITAMIN D) 1000 units tablet Take 1,000 Units by mouth daily.  . COD LIVER OIL PO Take 1 capsule by mouth daily.   . Coenzyme Q10-Vitamin E (COQ10-VITAMIN E PO) Take 1 tablet by mouth daily.  Marland Kitchen dicyclomine (BENTYL) 10 MG capsule Take 1 tablet by mouth every 6 hours as needed for cramping and IBS.  Marland Kitchen dicyclomine (BENTYL) 20 MG tablet Take 20 mg by mouth 2 (two) times daily as needed for spasms.   Marland Kitchen docusate sodium (COLACE) 100 MG capsule Take 100 mg by mouth daily.  Marland Kitchen guaiFENesin (MUCINEX) 600 MG 12 hr tablet Take 600 mg by mouth 2 (two) times daily as needed (congestion).  . Multiple Minerals-Vitamins (CAL-MAG-ZINC-D PO) Take 3 tablets by mouth daily.  . Multiple Vitamin (MULTIVITAMIN WITH MINERALS) TABS tablet Take 2 tablets by mouth daily.  . Omega-3 Fatty Acids (OMEGA 3 500 PO) Take 500 mg by mouth daily.  . ondansetron (ZOFRAN) 4 MG tablet Take 1 tablet by mouth twice daily as needed for nausea.  . Probiotic Product (ALIGN PO) Take 1 capsule by mouth daily.   . Selenium 200 MCG CAPS Take 200 mcg by mouth daily.   . vitamin B-12 (CYANOCOBALAMIN) 1000 MCG tablet  Take 1,000 mcg by mouth daily.  . ciprofloxacin (CIPRO) 500 MG tablet Take 1 tablet (500 mg total) by mouth 2 (two) times daily.  . metroNIDAZOLE (FLAGYL) 500 MG tablet Take 1 tablet (500 mg total) by mouth 2 (two) times daily.  . [DISCONTINUED] oxyCODONE (ROXICODONE) 5 MG immediate release tablet Take 1 tablet (5 mg total) by mouth every 4 (four) hours as needed. (Patient not taking: Reported on 05/03/2018)  . [DISCONTINUED] pantoprazole (PROTONIX) 20 MG tablet Take 1 tablet by mouth every morning. (Patient not taking: Reported on 05/03/2018)  . [DISCONTINUED] polyethylene glycol (MIRALAX / GLYCOLAX) packet  Take 17 g by mouth 2 (two) times daily. (Patient not taking: Reported on 05/03/2018)  . [DISCONTINUED] TURMERIC PO Take 900 mg by mouth daily.   No facility-administered encounter medications on file as of 05/03/2018.    Allergies  Allergen Reactions  . Sulfa Antibiotics Anaphylaxis  . Erythromycin     Bloody stool  . Lexapro [Escitalopram Oxalate]     "spacey" flu like symptoms   . Nsaids     Bloody stool, abdominal pain  . Pantoprazole Sodium Nausea Only  . Sucralfate     Unknown reaction  . Voltaren [Diclofenac Sodium] Other (See Comments)    Bloody stool, abdominal pain  . Sudafed [Pseudoephedrine Hcl] Palpitations and Other (See Comments)    Dizziness, fainting    Patient Active Problem List   Diagnosis Date Noted  . Hx of adenomatous colonic polyps 03/08/2018  . Osteoarthritis of right knee 01/19/2018  . S/P knee replacement 01/19/2018  . Insomnia 11/04/2017  . Anxiety   . Colonic diverticular abscess 11/01/2017  . Diverticulitis 10/29/2017  . Impingement syndrome of right shoulder region 06/16/2017  . Irritable bowel syndrome with constipation 02/15/2017  . Gastroesophageal reflux disease 11/10/2016  . Diverticulosis of large intestine without hemorrhage 10/14/2015  . 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 Generalized anxiety disorder 05/08/2015  . 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 Marital status: Widowed    Spouse name: Not on file  . Number of children: Not on file  . Years of education: Not on file  . Highest education level: Not on file  Occupational History  . Not on file  Social Needs  . Financial resource strain: Not on file  . Food insecurity:    Worry: Not on file    Inability: Not on file  . Transportation needs:    Medical: Not on file    Non-medical: Not on file  Tobacco Use  . Smoking status: Former Smoker    Years: 2.00  . Smokeless tobacco: Never Used  . Tobacco comment: Few cigarettes during her  'teen' years  Substance and Sexual Activity  . Alcohol use: Yes    Comment: occas  . Drug use: No  . Sexual activity: Never  Lifestyle  . Physical activity:    Days per week: Not on file    Minutes per session: Not on file  . Stress: Not on file  Relationships  . Social connections:    Talks on phone: Not on file    Gets together: Not on file    Attends religious service: Not on file    Active member of club or organization: Not on file    Attends meetings of clubs or organizations: Not on file    Relationship status: Not on file  . Intimate partner violence:    Fear of current or ex partner: Not on file  Emotionally abused: Not on file    Physically abused: Not on file    Forced sexual activity: Not on file  Other Topics Concern  . Not on file  Social History Narrative  . Not on file    Ms. Melgoza's family history includes Alcohol abuse in her maternal uncle and mother; Bipolar disorder in her mother; Breast cancer in her cousin, mother, and paternal grandmother; Cancer in her father; Diabetes in her father; Drug abuse in her cousin; Heart disease in her father and mother; OCD in an other family member; Prostate cancer in her father; Uterine cancer in her mother.      Objective:    Vitals:   05/03/18 1411  BP: 122/70  Pulse: (!) 55    Physical Exam; well-developed older white female in no acute distress, pleasant, BMI 21.1.  HEENT; nontraumatic normocephalic EOMI PERRLA sclera anicteric oral mucosa moist, Cardiovascular regular rate and rhythm with S1-S2, Pulmonary; clear bilaterally, Abdomen ;soft, bowel sounds are present she is nondistended, no palpable mass or hepatosplenomegaly, there is tenderness in the left lower quadrant and suprapubic area without guarding.  Rectal; exam not done, Extremities; no clubbing cyanosis or edema skin warm and dry, Neuropsych; alert and oriented, grossly nonfocal mood and affect appropriate       Assessment & Plan:   #89  73 year old white female with 5-day history of left lower quadrant and suprapubic discomfort and prior history of complicated diverticulitis. Current symptoms are very consistent with acute diverticulitis.  2 history of diverticulitis with particular abscess July 2019, abscess was less than 3 cm and managed conservatively #3 [place] IBS 4.  Post appendectomy and cholecystectomy 5.  Anxiety  Plan; Will start Cipro 500 mg p.o. twice daily x10 days and Flagyl 500 mg p.o. twice daily x10 days, both to be taken with food. She is advised to gradually advance her diet over the next 3 to 4 days as tolerated. She may use stool softeners and/or MiraLAX for constipation. Check CBC with differential today Patient is asked to call the office if her symptoms have not completely resolved when she has completed the antibiotics.  She also is advised to call should her symptoms worsen at any point in the interim, at which time CT imaging would be indicated.  Heyward Douthit S Maja Mccaffery PA-C 05/03/2018   Cc: Nicholes Rough, PA-C

## 2018-05-04 NOTE — Progress Notes (Signed)
Reviewed and agree with management plan.  Malcolm T. Stark, MD FACG 

## 2018-05-24 ENCOUNTER — Telehealth: Payer: Self-pay | Admitting: Physician Assistant

## 2018-05-24 ENCOUNTER — Other Ambulatory Visit: Payer: Self-pay

## 2018-05-24 MED ORDER — DICYCLOMINE HCL 20 MG PO TABS: 20.0000 mg | ORAL_TABLET | Freq: Two times a day (BID) | ORAL | 8 refills | Status: DC | PRN

## 2018-05-24 NOTE — Telephone Encounter (Signed)
Yes.. that is fine - send Rx for dicyclomine 20 mg po BID #60/8... glad it is working

## 2018-05-24 NOTE — Telephone Encounter (Signed)
Left a message for the patient to let her know the prescription has been transmitted [facility].

## 2018-05-24 NOTE — Telephone Encounter (Signed)
She reports she is feeling "great." "Things are like they are supposed to be." She has been taking 2 Dicyclomine 10 mg once to twice a day. Asks if we will prescribe Dicyclomine 20 mg instead. [facility] [address]

## 2018-05-28 ENCOUNTER — Other Ambulatory Visit: Payer: Self-pay

## 2018-05-28 ENCOUNTER — Emergency Department (HOSPITAL_COMMUNITY)
Admission: EM | Admit: 2018-05-28 | Discharge: 2018-05-28 | Disposition: A | Discharge status: Home / Self Care | Payer: MEDICARE | Attending: Emergency Medicine | Admitting: Emergency Medicine

## 2018-05-28 ENCOUNTER — Encounter (HOSPITAL_COMMUNITY): Payer: Self-pay | Admitting: *Deleted

## 2018-05-28 ENCOUNTER — Emergency Department (HOSPITAL_COMMUNITY): Payer: MEDICARE

## 2018-05-28 DIAGNOSIS — Z5321 Procedure and treatment not carried out due to patient leaving prior to being seen by health care provider: Secondary | ICD-10-CM | POA: Diagnosis not present

## 2018-05-28 DIAGNOSIS — R079 Chest pain, unspecified: Secondary | ICD-10-CM | POA: Insufficient documentation

## 2018-05-28 LAB — CBC
HCT: 38.7 % (ref 36.0–46.0)
Hemoglobin: 12.1 g/dL (ref 12.0–15.0)
MCH: 26.4 pg (ref 26.0–34.0)
MCHC: 31.3 g/dL (ref 30.0–36.0)
MCV: 84.3 fL (ref 80.0–100.0)
Platelets: 257 10*3/uL (ref 150–400)
RBC: 4.59 MIL/uL (ref 3.87–5.11)
RDW: 13.4 % (ref 11.5–15.5)
WBC: 6.3 10*3/uL (ref 4.0–10.5)
nRBC: 0 % (ref 0.0–0.2)

## 2018-05-28 LAB — BASIC METABOLIC PANEL
Anion gap: 8 (ref 5–15)
BUN: 16 mg/dL (ref 8–23)
CO2: 27 mmol/L (ref 22–32)
Calcium: 9.3 mg/dL (ref 8.9–10.3)
Chloride: 98 mmol/L (ref 98–111)
Creatinine, Ser: 0.67 mg/dL (ref 0.44–1.00)
GFR calc Af Amer: >60 mL/min (ref >60)
GFR calc non Af Amer: >60 mL/min (ref >60)
Glucose, Bld: 96 mg/dL (ref 70–99)
Potassium: 4 mmol/L (ref 3.5–5.1)
SODIUM: 133 mmol/L — AB (ref 135–145)

## 2018-05-28 LAB — I-STAT TROPONIN, ED: Troponin i, poc: 0 ng/mL (ref 0.00–0.08)

## 2018-05-28 MED ORDER — SODIUM CHLORIDE 0.9% FLUSH: 3.0000 mL | Freq: Once | INTRAVENOUS | Status: DC

## 2018-05-28 NOTE — ED Triage Notes (Signed)
Pt endorses R upper chest pain since last night.  She reports recent knee surgery in Sept.  She started with SOB x 2-3 days prior to developing cp.  Denies any SOB at present.  She is A&Ox 4, in NAD.

## 2018-05-28 NOTE — ED Notes (Signed)
Pt stated she could not wait any longer. This RN informed her the risks of leaving.

## 2018-06-28 HISTORY — PX: KNEE ARTHROSCOPY: SUR90

## 2019-03-06 ENCOUNTER — Encounter: Payer: Self-pay | Admitting: Pulmonary Disease

## 2019-03-06 ENCOUNTER — Other Ambulatory Visit: Payer: Self-pay

## 2019-03-06 ENCOUNTER — Ambulatory Visit (INDEPENDENT_AMBULATORY_CARE_PROVIDER_SITE_OTHER): Payer: MEDICARE | Admitting: Pulmonary Disease

## 2019-03-06 DIAGNOSIS — R918 Other nonspecific abnormal finding of lung field: Secondary | ICD-10-CM | POA: Diagnosis not present

## 2019-03-06 NOTE — Patient Instructions (Signed)
I reviewed the CT scan which shows some scarring in the upper part of the lung.  This looks benign by report Will be helpful to get the actual images on a CD to review We will get a follow-up CT without contrast in 6 months to make sure it does not change in size  Follow-up in 6 months after CT scan

## 2019-03-06 NOTE — Progress Notes (Signed)
Alexandria Chapman    [id number]    10/06/1945  Primary Care Physician:Beal, Earma Reading  Referring Physician: Nicholes Rough, PA-C [address] [place],  [place] [address]  Chief complaint: Follow-up for cough, abnormal CT  HPI: 73 year old with IBS, GERD, hiatal hernia, chronic cough Follows with [facility].  Complains of chronic cough for past several years.  Nonproductive in nature.  No associated fevers, chills She is placed on Pepcid twice daily by GI with improvement in symptoms.  Also has postnasal drip  Had a CT chest as a follow-up after an abnormal chest x-ray earlier this year.  CT shows apical scarring and she has been referred to pulmonary for further evaluation  Pets: Dogs, no cats, birds, farm animals Occupation: Retired Lawyer Exposures: No known exposures.  No mold, hot tub, Jacuzzi Smoking history: Minimal smoking history.  Quit in 1990 Travel history: Generally from [place].  Moved to [place] in 2015 Relevant family history: No significant family history of lung disease  Outpatient Encounter Medications as of 03/06/2019  Medication Sig  . ALPRAZolam (XANAX) 0.25 MG tablet Take 0.25 mg by mouth at bedtime.  . cholecalciferol (VITAMIN D) 1000 units tablet Take 1,000 Units by mouth daily.  Marland Kitchen dicyclomine (BENTYL) 20 MG tablet Take 1 tablet (20 mg total) by mouth 2 (two) times daily as needed for spasms.  Marland Kitchen docusate sodium (COLACE) 100 MG capsule Take 100 mg by mouth daily.  Marland Kitchen guaiFENesin (MUCINEX) 600 MG 12 hr tablet Take 600 mg by mouth 2 (two) times daily as needed (congestion).  . Multiple Minerals-Vitamins (CAL-MAG-ZINC-D PO) Take 3 tablets by mouth daily.  . Multiple Vitamin (MULTIVITAMIN WITH MINERALS) TABS tablet Take 2 tablets by mouth daily.  . Probiotic Product (ALIGN PO) Take 1 capsule by mouth daily.   . Selenium 200 MCG CAPS Take 200 mcg by mouth daily.   . vitamin B-12 (CYANOCOBALAMIN) 1000 MCG tablet Take 1,000 mcg by  mouth daily.  . [DISCONTINUED] ciprofloxacin (CIPRO) 500 MG tablet Take 1 tablet (500 mg total) by mouth 2 (two) times daily.  . [DISCONTINUED] COD LIVER OIL PO Take 1 capsule by mouth daily.   . [DISCONTINUED] Coenzyme Q10-Vitamin E (COQ10-VITAMIN E PO) Take 1 tablet by mouth daily.  . [DISCONTINUED] metroNIDAZOLE (FLAGYL) 500 MG tablet Take 1 tablet (500 mg total) by mouth 2 (two) times daily.  . [DISCONTINUED] Omega-3 Fatty Acids (OMEGA 3 500 PO) Take 500 mg by mouth daily.  . [DISCONTINUED] ondansetron (ZOFRAN) 4 MG tablet Take 1 tablet by mouth twice daily as needed for nausea.   No facility-administered encounter medications on file as of 03/06/2019.     Allergies as of 03/06/2019 - Review Complete 03/06/2019  Allergen Reaction Noted  . Sulfa antibiotics Anaphylaxis 10/01/2015  . Erythromycin  10/01/2015  . Lexapro [escitalopram oxalate]  01/08/2018  . Nsaids  10/01/2015  . Pantoprazole sodium Nausea Only 01/08/2018  . Sucralfate  10/01/2015  . Voltaren [diclofenac sodium] Other (See Comments) 10/30/2017  . Sudafed [pseudoephedrine hcl] Palpitations and Other (See Comments) 10/01/2015    Past Medical History:  Diagnosis Date  . Anemia   . Anxiety   . Cancer ([place])    RLE squamous cell removed  . Diverticulosis   . IBS (irritable bowel syndrome)   . TMJ disorder involving articular disc abnormality     Past Surgical History:  Procedure Laterality Date  . ABDOMINAL HYSTERECTOMY    . APPENDECTOMY    .  CHOLECYSTECTOMY    . COLONOSCOPY     september 2015 said she has them every 5 years  . CYST EXCISION Right 10/06/2015   Procedure: RIGHT LONG FINGER EXCISION MUCOID CYST;  Surgeon: Leanora Cover, MD;  Location: [facility];  Service: Orthopedics;  Laterality: Right;  Bier block  . ESOPHAGOGASTRODUODENOSCOPY  2007  . KNEE ARTHROSCOPY    . OOPHORECTOMY    . PAROTID GLAND TUMOR EXCISION Left   . SHOULDER ARTHROSCOPY Right   . TONSILLECTOMY    . TOTAL KNEE  ARTHROPLASTY Right 01/19/2018   Procedure: RIGHT TOTAL KNEE ARTHROPLASTY;  Surgeon: Sydnee Cabal, MD;  Location: [facility];  Service: Orthopedics;  Laterality: Right;    Family History  Problem Relation Age of Onset  . Alcohol abuse Mother   . Bipolar disorder Mother   . Breast cancer Mother   . Uterine cancer Mother   . Heart disease Mother   . Alcohol abuse Maternal Uncle   . Drug abuse Cousin   . OCD Other   . Breast cancer Paternal Grandmother   . Breast cancer Cousin        mom side   . Prostate cancer Father   . Cancer Father        tonsils, heavy smoker  . Diabetes Father   . Heart disease Father   . Colon cancer Neg Hx   . Esophageal cancer Neg Hx     Social History   Socioeconomic History  . Marital status: Widowed    Spouse name: Not on file  . Number of children: Not on file  . Years of education: Not on file  . Highest education level: Not on file  Occupational History  . Not on file  Social Needs  . Financial resource strain: Not on file  . Food insecurity    Worry: Not on file    Inability: Not on file  . Transportation needs    Medical: Not on file    Non-medical: Not on file  Tobacco Use  . Smoking status: Former Smoker    Packs/day: 0.25    Years: 2.00    Pack years: 0.50    Types: Cigarettes  . Smokeless tobacco: Never Used  . Tobacco comment: Few cigarettes during her 'teen' years  Substance and Sexual Activity  . Alcohol use: Yes    Comment: occas  . Drug use: No  . Sexual activity: Never  Lifestyle  . Physical activity    Days per week: Not on file    Minutes per session: Not on file  . Stress: Not on file  Relationships  . Social Herbalist on phone: Not on file    Gets together: Not on file    Attends religious service: Not on file    Active member of club or organization: Not on file    Attends meetings of clubs or organizations: Not on file    Relationship status: Not on file  . Intimate partner violence    Fear  of current or ex partner: Not on file    Emotionally abused: Not on file    Physically abused: Not on file    Forced sexual activity: Not on file  Other Topics Concern  . Not on file  Social History Narrative  . Not on file    Review of systems: Review of Systems  Constitutional: Negative for fever and chills.  HENT: Negative.   Eyes: Negative for blurred vision.  Respiratory:  as per HPI  Cardiovascular: Negative for chest pain and palpitations.  Gastrointestinal: Negative for vomiting, diarrhea, blood per rectum. Genitourinary: Negative for dysuria, urgency, frequency and hematuria.  Musculoskeletal: Negative for myalgias, back pain and joint pain.  Skin: Negative for itching and rash.  Neurological: Negative for dizziness, tremors, focal weakness, seizures and loss of consciousness.  Endo/Heme/Allergies: Negative for environmental allergies.  Psychiatric/Behavioral: Negative for depression, suicidal ideas and hallucinations.  All other systems reviewed and are negative.  Physical Exam: Blood pressure 120/70, pulse 61, temperature (!) 97.2 F (36.2 C), temperature source Temporal, height 5' 5.5" (1.664 m), weight 127 lb 9.6 oz (57.9 kg), SpO2 96 %. Gen:      No acute distress HEENT:  EOMI, sclera anicteric Neck:     No masses; no thyromegaly Lungs:    Clear to auscultation bilaterally; normal respiratory effort CV:         Regular rate and rhythm; no murmurs Abd:      + bowel sounds; soft, non-tender; no palpable masses, no distension Ext:    No edema; adequate peripheral perfusion Skin:      Warm and dry; no rash Neuro: alert and oriented x 3 Psych: normal mood and affect  Data Reviewed: Imaging: Chest x-ray 05/28/2018-mild nodular density in the right lung apex.  I have reviewed the images personally.  CT chest ([facility]) 02/19/2019 1. Bilateral upper lobes of the lungs, there is pleural-based tissue density likely representing scarring and correlates with findings from  chest x-ray. An acute inflammatory/infectious or malignant process is less likely. 2.  In the right breast, there is a 11 x 14 mm nodular density. Recommend correlation with any prior mammographic studies or new mammogram for further evaluation.  Assessment:  Chronic cough Likely upper airway cough, GERD related Continue Pepcid twice daily We discussed trial of steroid nasal spray and antihistamine for postnasal drip but she states that she has had adverse reactions to these in the past and would like to hold off  Abnormal CT I have reviewed the CT report and do not have the images available By report it appears to be scarring in the apices.  Suspicion for malignancy is low as she has minimal smoking history. She will try to obtain the images on CD for review Order follow-up CT without contrast in 6 months  CT also shows right breast nodular density.  As per the patient she has a known cyst in that area that has been aspirated in the past and is felt to be benign.  She has regular mammogram follow-up.  Plan/Recommendations: - Continue Pepcid - Obtain CT images to review. - Follow up CT chest without contrast in 6 months  Marshell Garfinkel MD [facility] 03/06/2019, 8:58 AM  CC: Nicholes Rough, PA-C

## 2019-03-08 ENCOUNTER — Telehealth: Payer: Self-pay | Admitting: Pulmonary Disease

## 2019-03-08 NOTE — Telephone Encounter (Signed)
CD was dropped off for Patient by courier this morning by courier.  CD is currently in Dr. Matilde Bash box.  Message routed to [place] to follow up

## 2019-03-08 NOTE — Telephone Encounter (Signed)
Disk received and given to Dr. Vaughan Browner.

## 2019-05-16 ENCOUNTER — Ambulatory Visit: Payer: MEDICARE | Attending: Internal Medicine

## 2019-05-16 DIAGNOSIS — Z23 Encounter for immunization: Secondary | ICD-10-CM | POA: Insufficient documentation

## 2019-05-16 NOTE — Progress Notes (Signed)
   Covid-19 Vaccination Clinic  Name:  Alexandria Chapman    MRN: [id number] DOB: 04/16/1946  05/16/2019  [name] was observed post Covid-19 immunization for 30 minutes based on pre-vaccination screening without incidence. She was provided with Vaccine Information Sheet and instruction to access the V-Safe system.   [name] was instructed to call 911 with any severe reactions post vaccine: Marland Kitchen Difficulty breathing  . Swelling of your face and throat  . A fast heartbeat  . A bad rash all over your body  . Dizziness and weakness    Immunizations Administered    Name Date Dose VIS Date Route   Pfizer COVID-19 Vaccine 05/16/2019  2:32 PM 0.3 mL 04/05/2019 Intramuscular   Manufacturer: [place]   Lot: [id number]   [place]: [id number]

## 2019-06-06 ENCOUNTER — Ambulatory Visit: Payer: MEDICARE | Attending: Internal Medicine

## 2019-06-06 DIAGNOSIS — Z23 Encounter for immunization: Secondary | ICD-10-CM

## 2019-06-06 NOTE — Progress Notes (Signed)
   Covid-19 Vaccination Clinic  Name:  Alexandria Chapman    MRN: [id number] DOB: 11/12/1945  06/06/2019  [name] was observed post Covid-19 immunization for 15 minutes without incidence. She was provided with Vaccine Information Sheet and instruction to access the V-Safe system.   [name] was instructed to call 911 with any severe reactions post vaccine: Marland Kitchen Difficulty breathing  . Swelling of your face and throat  . A fast heartbeat  . A bad rash all over your body  . Dizziness and weakness    Immunizations Administered    Name Date Dose VIS Date Route   Pfizer COVID-19 Vaccine 06/06/2019  3:42 PM 0.3 mL 04/05/2019 Intramuscular   Manufacturer: [place]   Lot: [id number]   [place]: [id number]

## 2019-06-12 ENCOUNTER — Other Ambulatory Visit: Payer: Self-pay

## 2019-06-12 ENCOUNTER — Other Ambulatory Visit: Payer: Self-pay | Admitting: Physician Assistant

## 2019-06-12 MED ORDER — DICYCLOMINE HCL 20 MG PO TABS: 20.0000 mg | ORAL_TABLET | Freq: Two times a day (BID) | ORAL | 3 refills | Status: DC | PRN

## 2019-09-03 ENCOUNTER — Other Ambulatory Visit: Payer: Self-pay

## 2019-09-03 ENCOUNTER — Encounter (HOSPITAL_COMMUNITY): Payer: Self-pay

## 2019-09-03 ENCOUNTER — Ambulatory Visit (HOSPITAL_COMMUNITY)
Admission: RE | Admit: 2019-09-03 | Discharge: 2019-09-03 | Disposition: A | Discharge status: Home / Self Care | Payer: MEDICARE | Source: Ambulatory Visit | Attending: Pulmonary Disease | Admitting: Pulmonary Disease

## 2019-09-03 DIAGNOSIS — R918 Other nonspecific abnormal finding of lung field: Secondary | ICD-10-CM | POA: Insufficient documentation

## 2019-11-07 ENCOUNTER — Other Ambulatory Visit: Payer: Self-pay | Admitting: Orthopedic Surgery

## 2019-11-07 ENCOUNTER — Other Ambulatory Visit (HOSPITAL_COMMUNITY): Payer: Self-pay | Admitting: Orthopedic Surgery

## 2019-11-07 DIAGNOSIS — Z96651 Presence of right artificial knee joint: Secondary | ICD-10-CM

## 2019-11-07 DIAGNOSIS — M25561 Pain in right knee: Secondary | ICD-10-CM

## 2019-11-19 ENCOUNTER — Encounter (HOSPITAL_COMMUNITY)
Admission: RE | Admit: 2019-11-19 | Discharge: 2019-11-19 | Disposition: A | Discharge status: Home / Self Care | Payer: MEDICARE | Source: Ambulatory Visit | Attending: Orthopedic Surgery | Admitting: Orthopedic Surgery

## 2019-11-19 DIAGNOSIS — Z96651 Presence of right artificial knee joint: Secondary | ICD-10-CM | POA: Diagnosis not present

## 2019-11-19 DIAGNOSIS — M25561 Pain in right knee: Secondary | ICD-10-CM | POA: Insufficient documentation

## 2019-11-19 MED ORDER — TECHNETIUM TC 99M MEDRONATE IV KIT
20.0000 | PACK | Freq: Once | INTRAVENOUS | Status: AC | PRN
  Administered 2019-11-19: 20 via INTRAVENOUS

## 2019-12-19 ENCOUNTER — Ambulatory Visit (INDEPENDENT_AMBULATORY_CARE_PROVIDER_SITE_OTHER): Payer: MEDICARE | Admitting: Gastroenterology

## 2019-12-19 ENCOUNTER — Encounter: Payer: Self-pay | Admitting: Gastroenterology

## 2019-12-19 VITALS — BP 138/74 | HR 63 | Ht 66.0 in | Wt 126.0 lb

## 2019-12-19 DIAGNOSIS — K589 Irritable bowel syndrome without diarrhea: Secondary | ICD-10-CM | POA: Diagnosis not present

## 2019-12-19 DIAGNOSIS — K219 Gastro-esophageal reflux disease without esophagitis: Secondary | ICD-10-CM | POA: Diagnosis not present

## 2019-12-19 DIAGNOSIS — Z86010 Personal history of colonic polyps: Secondary | ICD-10-CM

## 2019-12-19 MED ORDER — FAMOTIDINE 20 MG PO TABS: 20.0000 mg | ORAL_TABLET | Freq: Two times a day (BID) | ORAL | 3 refills | Status: AC

## 2019-12-19 MED ORDER — SUTAB 1479-225-188 MG PO TABS: 1.0000 | ORAL_TABLET | Freq: Once | ORAL | 0 refills | Status: AC

## 2019-12-19 MED ORDER — DICYCLOMINE HCL 20 MG PO TABS: 20.0000 mg | ORAL_TABLET | Freq: Two times a day (BID) | ORAL | 3 refills | Status: DC | PRN

## 2019-12-19 NOTE — Progress Notes (Signed)
Reviewed and agree with management plan.  Zeph Riebel T. Kaytie Ratcliffe, MD FACG [facility]  

## 2019-12-19 NOTE — Progress Notes (Signed)
12/19/2019 Alexandria Chapman [id number] Jul 24, 1945   HISTORY OF PRESENT ILLNESS: This is a 74 year old female whose care has been established with Dr. Fuller Plan although she has only been seen previously by Nicoletta Ba, PA, on 2 occasions in the past.  She has history of IBS.  She tells me that she believes she had a flare of her IBS recently beginning in the second week in July.  She said that she had a very stressful family situation which has now at this point resolved.  She says that at that time she just did not feel well in general as per her usual as when her IBS starts kicking in.  She says that she was having to use her Zofran a lot for nausea.  Started having severe indigestion and belching with burning type sensation in her chest.  She had a 2-week period where she was having 4-6 bowel movements per day with a lot of gas and were very explosive.  She denies seeing any blood in her stool.  She was taking Pepto-Bismol for that.  Also has been using her dicyclomine 20 mg twice daily and sometimes more often than that.  She takes align probiotic every day.  She started taking Pepcid 20 mg every morning and feels that that has helped with her indigestion type symptoms.  Since Monday her bowel habits have gone back to her normal with more constipation issue so she has restarted her MiraLAX.  She has a history of adenomatous colon polyps.  Last colonoscopy was performed in September 2015.    Past Medical History:  Diagnosis Date  . Anemia   . Anxiety   . Cancer ([place])    RLE squamous cell removed  . Diverticulosis   . IBS (irritable bowel syndrome)   . TMJ disorder involving articular disc abnormality    Past Surgical History:  Procedure Laterality Date  . ABDOMINAL HYSTERECTOMY    . APPENDECTOMY    . CHOLECYSTECTOMY    . COLONOSCOPY     september 2015 said she has them every 5 years  . CYST EXCISION Right 10/06/2015   Procedure: RIGHT LONG FINGER EXCISION MUCOID CYST;  Surgeon: Leanora Cover, MD;  Location: [facility];  Service: Orthopedics;  Laterality: Right;  Bier block  . ESOPHAGOGASTRODUODENOSCOPY  2007  . KNEE ARTHROSCOPY    . KNEE ARTHROSCOPY Right 06/28/2018  . OOPHORECTOMY    . PAROTID GLAND TUMOR EXCISION Left   . SHOULDER ARTHROSCOPY Right   . TONSILLECTOMY    . TOTAL KNEE ARTHROPLASTY Right 01/19/2018   Procedure: RIGHT TOTAL KNEE ARTHROPLASTY;  Surgeon: Sydnee Cabal, MD;  Location[facility];  Service: Orthopedics;  Laterality: Right;    reports that she has quit smoking. Her smoking use included cigarettes. She has a 0.50 pack-year smoking history. She has never used smokeless tobacco. She reports current alcohol use. She reports that she does not use drugs. family history includes Alcohol abuse in her maternal uncle and mother; Bipolar disorder in her mother; Breast cancer in her cousin, mother, and paternal grandmother; Cancer in her father; Diabetes in her father; Drug abuse in her cousin; Heart disease in her father and mother; OCD in an other family member; Other in her brother; Prostate cancer in her father; Uterine cancer in her mother. Allergies  Allergen Reactions  . Sulfa Antibiotics Anaphylaxis  . Erythromycin     Bloody stool  . Lexapro [Escitalopram Oxalate]     "spacey" flu like symptoms   .  Nsaids     Bloody stool, abdominal pain  . Pantoprazole Sodium Nausea Only  . Sucralfate     Unknown reaction  . Voltaren [Diclofenac Sodium] Other (See Comments)    Bloody stool, abdominal pain  . Sudafed [Pseudoephedrine Hcl] Palpitations and Other (See Comments)    Dizziness, fainting       Outpatient Encounter Medications as of 12/19/2019  Medication Sig  . ALPRAZolam (XANAX) 0.25 MG tablet Take 0.25 mg by mouth at bedtime.  . AMBULATORY NON FORMULARY MEDICATION 2 capsules daily. Beet [place]  . dicyclomine (BENTYL) 20 MG tablet Take 1 tablet (20 mg total) by mouth 2 (two) times daily as needed for spasms.  Marland Kitchen docusate sodium  (COLACE) 100 MG capsule Take 100 mg by mouth daily.  . famotidine (PEPCID) 20 MG tablet Take 20 mg by mouth daily.  . Multiple Minerals-Vitamins (CAL-MAG-ZINC-D PO) Take 1-2 tablets by mouth daily.   . Multiple Vitamin (MULTIVITAMIN WITH MINERALS) TABS tablet Take 2 tablets by mouth daily.  . ondansetron (ZOFRAN) 4 MG tablet Take 4 mg by mouth as needed for nausea or vomiting.  . Probiotic Product (ALIGN PO) Take 1 capsule by mouth daily.   . [DISCONTINUED] guaiFENesin (MUCINEX) 600 MG 12 hr tablet Take 600 mg by mouth 2 (two) times daily as needed (congestion).  . polyethylene glycol powder (GLYCOLAX/MIRALAX) 17 GM/SCOOP powder Take 17 g by mouth as needed.  . [DISCONTINUED] cholecalciferol (VITAMIN D) 1000 units tablet Take 1,000 Units by mouth daily.  . [DISCONTINUED] Selenium 200 MCG CAPS Take 200 mcg by mouth daily.   . [DISCONTINUED] vitamin B-12 (CYANOCOBALAMIN) 1000 MCG tablet Take 1,000 mcg by mouth daily.   No facility-administered encounter medications on file as of 12/19/2019.     REVIEW OF SYSTEMS  : All other systems reviewed and negative except where noted in the History of Present Illness.   PHYSICAL EXAM: BP 138/74   Pulse 63   Ht 5\' 6"  (1.676 m)   Wt 126 lb (57.2 kg)   BMI 20.34 kg/m  General: Well developed white female in no acute distress Head: Normocephalic and atraumatic Eyes:  Sclerae anicteric, conjunctiva pink. Ears: Normal auditory acuity  Lungs: Clear throughout to auscultation; no increased WOB. Heart: Regular rate and rhythm; no M/R/G. Abdomen: Soft, non-distended.  BS present.  Non-tender. Rectal:  Will be done at the time of colonoscopy. Musculoskeletal: Symmetrical with no gross deformities  Skin: No lesions on visible extremities Extremities: No edema  Neurological: Alert oriented x 4, grossly non-focal Psychological:  Alert and cooperative. Normal mood and affect  ASSESSMENT AND PLAN: *Personal history of adenomatous colon polyps: Last  colonoscopy September 2015.  We will plan for colonoscopy with Dr. Fuller Plan.  We will give her a 2-day bowel prep as she says that she was difficult to clean out previously. *GERD: Was having more severe symptoms recently, but now improved on Pepcid daily.  Advised that she can increase the Pepcid to twice a day as well and she asked to have a prescription sent to her pharmacy.  We will send Pepcid 20 mg twice daily.  We will plan for EGD with Dr. Fuller Plan as well. *IBS: Sounds like she had a flare of her symptoms recently, but has been improving last several days.  We will renew her prescription for dicyclomine to be used as needed for her symptoms.  Prescription sent to pharmacy.  **The risks, benefits, and alternatives to EGD and colonoscopy were discussed with the patient and she consents  to proceed.  CC:  Vicenta Aly, [place]

## 2019-12-19 NOTE — Patient Instructions (Addendum)
If you are age 73 or older, your body mass index should be between 23-30. Your Body mass index is 20.34 kg/m. If this is out of the aforementioned range listed, please consider follow up with your Primary Care Provider.  If you are age 62 or younger, your body mass index should be between 19-25. Your Body mass index is 20.34 kg/m. If this is out of the aformentioned range listed, please consider follow up with your Primary Care Provider.   We have sent the following medications to your pharmacy for you to pick up at your convenience: Pepcid 20 mg twice daily. Dicyclomine 20 mg twice daily as needed for spasms.  You have been scheduled for an endoscopy and colonoscopy. Please follow the written instructions given to you at your visit today. Please pick up your prep supplies at the pharmacy within the next 1-3 days. If you use inhalers (even only as needed), please bring them with you on the day of your procedure.  Due to recent changes in healthcare laws, you may see the results of your imaging and laboratory studies on MyChart before your provider has had a chance to review them.  We understand that in some cases there may be results that are confusing or concerning to you. Not all laboratory results come back in the same time frame and the provider may be waiting for multiple results in order to interpret others.  Please give [place] 48 hours in order for your provider to thoroughly review all the results before contacting the office for clarification of your results.

## 2020-02-04 ENCOUNTER — Encounter: Payer: Self-pay | Admitting: Gastroenterology

## 2020-02-10 ENCOUNTER — Encounter: Payer: Self-pay | Admitting: Gastroenterology

## 2020-02-10 ENCOUNTER — Ambulatory Visit (AMBULATORY_SURGERY_CENTER): Payer: MEDICARE | Admitting: Gastroenterology

## 2020-02-10 ENCOUNTER — Other Ambulatory Visit: Payer: Self-pay

## 2020-02-10 VITALS — BP 138/68 | HR 51 | Temp 98.0°F | Resp 13 | Ht 66.0 in | Wt 126.0 lb

## 2020-02-10 DIAGNOSIS — K219 Gastro-esophageal reflux disease without esophagitis: Secondary | ICD-10-CM

## 2020-02-10 DIAGNOSIS — K2100 Gastro-esophageal reflux disease with esophagitis, without bleeding: Secondary | ICD-10-CM

## 2020-02-10 DIAGNOSIS — K621 Rectal polyp: Secondary | ICD-10-CM

## 2020-02-10 DIAGNOSIS — D128 Benign neoplasm of rectum: Secondary | ICD-10-CM

## 2020-02-10 DIAGNOSIS — K2950 Unspecified chronic gastritis without bleeding: Secondary | ICD-10-CM

## 2020-02-10 DIAGNOSIS — K449 Diaphragmatic hernia without obstruction or gangrene: Secondary | ICD-10-CM | POA: Diagnosis not present

## 2020-02-10 DIAGNOSIS — Z86010 Personal history of colonic polyps: Secondary | ICD-10-CM | POA: Diagnosis not present

## 2020-02-10 DIAGNOSIS — K2970 Gastritis, unspecified, without bleeding: Secondary | ICD-10-CM

## 2020-02-10 DIAGNOSIS — K317 Polyp of stomach and duodenum: Secondary | ICD-10-CM

## 2020-02-10 MED ORDER — SODIUM CHLORIDE 0.9 % IV SOLN: 500.0000 mL | Freq: Once | INTRAVENOUS | Status: DC

## 2020-02-10 NOTE — Op Note (Signed)
[facility] Patient Name: Alexandria Chapman Procedure Date: 02/10/2020 2:10 PM MRN: [id number] Endoscopist: Ladene Artist , MD Age: 74 Referring MD:  Date of Birth: 17-Dec-1945 Gender: Female Account #: [id number] Procedure:                Upper GI endoscopy Indications:              Gastroesophageal reflux disease Medicines:                Monitored Anesthesia Care Procedure:                Pre-Anesthesia Assessment:                           - Prior to the procedure, a History and Physical                            was performed, and patient medications and                            allergies were reviewed. The patient's tolerance of                            previous anesthesia was also reviewed. The risks                            and benefits of the procedure and the sedation                            options and risks were discussed with the patient.                            All questions were answered, and informed consent                            was obtained. Prior Anticoagulants: The patient has                            taken no previous anticoagulant or antiplatelet                            agents. ASA Grade Assessment: II - A patient with                            mild systemic disease. After reviewing the risks                            and benefits, the patient was deemed in                            satisfactory condition to undergo the procedure.                           After obtaining informed consent, the endoscope was  passed under direct vision. Throughout the                            procedure, the patient's blood pressure, pulse, and                            oxygen saturations were monitored continuously. The                            Endoscope was introduced through the mouth, and                            advanced to the second part of duodenum. The upper                            GI endoscopy was  accomplished without difficulty.                            The patient tolerated the procedure well. Scope In: Scope Out: Findings:                 Localized, white plaques were found in the mid                            esophagus. Biopsies were taken with a cold forceps                            for histology.                           The exam of the esophagus was otherwise normal.                           Diffuse moderate inflammation characterized by                            erythema, friability and granularity was found in                            the entire examined stomach. Biopsies were taken                            with a cold forceps for histology.                           A small hiatal hernia was present.                           A few small sessile polyps with no bleeding and no                            stigmata of recent bleeding were found in the                            gastric  fundus and in the gastric body. 5 polyps                            were biopsied. Biopsies were taken with a cold                            forceps for histology.                           The duodenal bulb and second portion of the                            duodenum were normal. Complications:            No immediate complications. Estimated Blood Loss:     Estimated blood loss was minimal. Impression:               - Esophageal plaques were found, suspicious for                            candidiasis. Biopsied.                           - Gastritis. Biopsied.                           - Small hiatal hernia.                           - A few gastric polyps. Biopsied.                           - Normal duodenal bulb and second portion of the                            duodenum. Recommendation:           - Patient has a contact number available for                            emergencies. The signs and symptoms of potential                            delayed complications were  discussed with the                            patient. Return to normal activities tomorrow.                            Written discharge instructions were provided to the                            patient.                           - Resume previous diet.                           -  Follow antireflux measures long term.                           - Continue present medications.                           - Await pathology results. Ladene Artist, MD 02/10/2020 3:09:31 PM This report has been signed electronically.

## 2020-02-10 NOTE — Progress Notes (Signed)
Called to room to assist during endoscopic procedure.  Patient ID and intended procedure confirmed with present staff. Received instructions for my participation in the procedure from the performing physician.  

## 2020-02-10 NOTE — Progress Notes (Signed)
Vitals-CW  History reviewed. 

## 2020-02-10 NOTE — Op Note (Signed)
[facility] Patient Name: Alexandria Chapman Procedure Date: 02/10/2020 2:10 PM MRN: [id number] Endoscopist: Ladene Artist , MD Age: 74 Referring MD:  Date of Birth: [birth year] Gender: Female Account #: [id number] Procedure:                Colonoscopy Indications:              Surveillance: Personal history of adenomatous                            polyps on last colonoscopy > 5 years ago Medicines:                Monitored Anesthesia Care Procedure:                Pre-Anesthesia Assessment:                           - Prior to the procedure, a History and Physical                            was performed, and patient medications and                            allergies were reviewed. The patient's tolerance of                            previous anesthesia was also reviewed. The risks                            and benefits of the procedure and the sedation                            options and risks were discussed with the patient.                            All questions were answered, and informed consent                            was obtained. Prior Anticoagulants: The patient has                            taken no previous anticoagulant or antiplatelet                            agents. ASA Grade Assessment: II - A patient with                            mild systemic disease. After reviewing the risks                            and benefits, the patient was deemed in                            satisfactory condition to undergo the procedure.  After obtaining informed consent, the colonoscope                            was passed under direct vision. Throughout the                            procedure, the patient's blood pressure, pulse, and                            oxygen saturations were monitored continuously. The                            Colonoscope was introduced through the anus and                            advanced to the the  cecum, identified by                            appendiceal orifice and ileocecal valve. The                            ileocecal valve, appendiceal orifice, and rectum                            were photographed. The quality of the bowel                            preparation was good. The colonoscopy was performed                            without difficulty. The patient tolerated the                            procedure well. Scope In: 2:23:34 PM Scope Out: 2:47:20 PM Scope Withdrawal Time: 0 hours 18 minutes 38 seconds  Total Procedure Duration: 0 hours 23 minutes 46 seconds  Findings:                 The perianal and digital rectal examinations were                            normal.                           Two sessile polyps were found in the rectum. The                            polyps were 6 mm in size. These polyps were removed                            with a cold snare. Resection and retrieval were                            complete.  Many small-mouthed diverticula were found in the                            left colon. There was narrowing of the colon in                            association with the diverticular opening. There                            was evidence of diverticular spasm. There was no                            evidence of diverticular bleeding.                           The exam was otherwise without abnormality on                            direct and retroflexion views. Complications:            No immediate complications. Estimated blood loss:                            None. Estimated Blood Loss:     Estimated blood loss: none. Impression:               - Two 6 mm polyps in the rectum, removed with a                            cold snare. Resected and retrieved.                           - Moderate diverticulosis in the left colon.                           - The examination was otherwise normal on direct                             and retroflexion views. Recommendation:           - Repeat colonoscopy date to be determined after                            pending pathology results are reviewed for                            surveillance based on pathology results. Consider                            no future surveillance colonoscopies if polyps are                            not precancerous.                           - Patient has a contact number available  for                            emergencies. The signs and symptoms of potential                            delayed complications were discussed with the                            patient. Return to normal activities tomorrow.                            Written discharge instructions were provided to the                            patient.                           - High fiber diet.                           - Continue present medications.                           - Await pathology results. Ladene Artist, MD 02/10/2020 3:02:54 PM This report has been signed electronically.

## 2020-02-10 NOTE — Patient Instructions (Signed)
Impression/Recommendations:  Gastritis, Hiatal hernia, Polyp, High fiber diet, and Diverticulosis handouts given to patient.  Resume previous diet. Follow antireflux measures. Continue present medications. Await pathology results.  YOU HAD AN ENDOSCOPIC PROCEDURE TODAY AT [facility]:   Refer to the procedure report that was given to you for any specific questions about what was found during the examination.  If the procedure report does not answer your questions, please call your gastroenterologist to clarify.  If you requested that your care partner not be given the details of your procedure findings, then the procedure report has been included in a sealed envelope for you to review at your convenience later.  YOU SHOULD EXPECT: Some feelings of bloating in the abdomen. Passage of more gas than usual.  Walking can help get rid of the air that was put into your GI tract during the procedure and reduce the bloating. If you had a lower endoscopy (such as a colonoscopy or flexible sigmoidoscopy) you may notice spotting of blood in your stool or on the toilet paper. If you underwent a bowel prep for your procedure, you may not have a normal bowel movement for a few days.  Please Note:  You might notice some irritation and congestion in your nose or some drainage.  This is from the oxygen used during your procedure.  There is no need for concern and it should clear up in a day or so.  SYMPTOMS TO REPORT IMMEDIATELY:   Following lower endoscopy (colonoscopy or flexible sigmoidoscopy):  Excessive amounts of blood in the stool  Significant tenderness or worsening of abdominal pains  Swelling of the abdomen that is new, acute  Fever of 100F or higher   Following upper endoscopy (EGD)  Vomiting of blood or coffee ground material  New chest pain or pain under the shoulder blades  Painful or persistently difficult swallowing  New shortness of breath  Fever of 100F or  higher  Black, tarry-looking stools  For urgent or emergent issues, a gastroenterologist can be reached at any hour by calling [phone]. Do not use MyChart messaging for urgent concerns.    DIET:  We do recommend a small meal at first, but then you may proceed to your regular diet.  Drink plenty of fluids but you should avoid alcoholic beverages for 24 hours.  ACTIVITY:  You should plan to take it easy for the rest of today and you should NOT DRIVE or use heavy machinery until tomorrow (because of the sedation medicines used during the test).    FOLLOW UP: Our staff will call the number listed on your records 48-72 hours following your procedure to check on you and address any questions or concerns that you may have regarding the information given to you following your procedure. If we do not reach you, we will leave a message.  We will attempt to reach you two times.  During this call, we will ask if you have developed any symptoms of COVID 19. If you develop any symptoms (ie: fever, flu-like symptoms, shortness of breath, cough etc.) before then, please call [phone].  If you test positive for Covid 19 in the 2 weeks post procedure, please call and report this information to [place].    If any biopsies were taken you will be contacted by phone or by letter within the next 1-3 weeks.  Please call us at [phone] if you have not heard about the biopsies in 3 weeks.    SIGNATURES/CONFIDENTIALITY: You  and/or your care partner have signed paperwork which will be entered into your electronic medical record.  These signatures attest to the fact that that the information above on your After Visit Summary has been reviewed and is understood.  Full responsibility of the confidentiality of this discharge information lies with you and/or your care-partner.

## 2020-02-10 NOTE — Progress Notes (Signed)
To PACU, VSS. Report to [email or web address] 

## 2020-02-12 ENCOUNTER — Telehealth: Payer: Self-pay

## 2020-02-12 NOTE — Telephone Encounter (Signed)
Left message on follow up call. 

## 2020-02-12 NOTE — Telephone Encounter (Signed)
No answer, left message to call if having any issues or concerns, B.Carmon Sahli RN 

## 2020-02-19 ENCOUNTER — Encounter: Payer: Self-pay | Admitting: Gastroenterology

## 2020-04-25 HISTORY — PX: EYE SURGERY: SHX253

## 2020-04-25 HISTORY — PX: CATARACT EXTRACTION, BILATERAL: SHX1313

## 2020-06-09 ENCOUNTER — Other Ambulatory Visit: Payer: Self-pay

## 2020-06-09 ENCOUNTER — Emergency Department (HOSPITAL_BASED_OUTPATIENT_CLINIC_OR_DEPARTMENT_OTHER): Payer: MEDICARE

## 2020-06-09 ENCOUNTER — Encounter (HOSPITAL_BASED_OUTPATIENT_CLINIC_OR_DEPARTMENT_OTHER): Payer: Self-pay

## 2020-06-09 ENCOUNTER — Emergency Department (HOSPITAL_BASED_OUTPATIENT_CLINIC_OR_DEPARTMENT_OTHER)
Admission: EM | Admit: 2020-06-09 | Discharge: 2020-06-09 | Disposition: A | Discharge status: Home / Self Care | Payer: MEDICARE | Attending: Emergency Medicine | Admitting: Emergency Medicine

## 2020-06-09 DIAGNOSIS — Z85828 Personal history of other malignant neoplasm of skin: Secondary | ICD-10-CM | POA: Insufficient documentation

## 2020-06-09 DIAGNOSIS — R110 Nausea: Secondary | ICD-10-CM | POA: Insufficient documentation

## 2020-06-09 DIAGNOSIS — R072 Precordial pain: Secondary | ICD-10-CM | POA: Diagnosis not present

## 2020-06-09 DIAGNOSIS — Z96651 Presence of right artificial knee joint: Secondary | ICD-10-CM | POA: Insufficient documentation

## 2020-06-09 DIAGNOSIS — R5383 Other fatigue: Secondary | ICD-10-CM

## 2020-06-09 DIAGNOSIS — Z87891 Personal history of nicotine dependence: Secondary | ICD-10-CM | POA: Diagnosis not present

## 2020-06-09 DIAGNOSIS — Z20822 Contact with and (suspected) exposure to covid-19: Secondary | ICD-10-CM | POA: Diagnosis not present

## 2020-06-09 DIAGNOSIS — R0602 Shortness of breath: Secondary | ICD-10-CM | POA: Diagnosis not present

## 2020-06-09 DIAGNOSIS — R197 Diarrhea, unspecified: Secondary | ICD-10-CM | POA: Insufficient documentation

## 2020-06-09 DIAGNOSIS — R079 Chest pain, unspecified: Secondary | ICD-10-CM

## 2020-06-09 LAB — COMPREHENSIVE METABOLIC PANEL
ALT: 12 U/L (ref 0–44)
AST: 21 U/L (ref 15–41)
Albumin: 4.1 g/dL (ref 3.5–5.0)
Alkaline Phosphatase: 65 U/L (ref 38–126)
Anion gap: 4 — ABNORMAL LOW (ref 5–15)
BUN: 10 mg/dL (ref 8–23)
CO2: 30 mmol/L (ref 22–32)
Calcium: 9.6 mg/dL (ref 8.9–10.3)
Chloride: 99 mmol/L (ref 98–111)
Creatinine, Ser: 0.79 mg/dL (ref 0.44–1.00)
GFR, Estimated: >60 mL/min (ref >60)
Glucose, Bld: 90 mg/dL (ref 70–99)
Potassium: 3.8 mmol/L (ref 3.5–5.1)
Sodium: 133 mmol/L — ABNORMAL LOW (ref 135–145)
Total Bilirubin: 0.6 mg/dL (ref 0.3–1.2)
Total Protein: 6.9 g/dL (ref 6.5–8.1)

## 2020-06-09 LAB — CBC
HCT: 40.6 % (ref 36.0–46.0)
Hemoglobin: 13.3 g/dL (ref 12.0–15.0)
MCH: 28.2 pg (ref 26.0–34.0)
MCHC: 32.8 g/dL (ref 30.0–36.0)
MCV: 86 fL (ref 80.0–100.0)
Platelets: 289 10*3/uL (ref 150–400)
RBC: 4.72 MIL/uL (ref 3.87–5.11)
RDW: 12.3 % (ref 11.5–15.5)
WBC: 5.3 10*3/uL (ref 4.0–10.5)
nRBC: 0 % (ref 0.0–0.2)

## 2020-06-09 LAB — TROPONIN I (HIGH SENSITIVITY)
Troponin I (High Sensitivity): 7 ng/L (ref <18)
Troponin I (High Sensitivity): 7 ng/L (ref <18)

## 2020-06-09 LAB — SARS CORONAVIRUS 2 (TAT 6-24 HRS): SARS Coronavirus 2: NEGATIVE

## 2020-06-09 NOTE — ED Notes (Signed)
Pt ambulated to restroom without difficulty

## 2020-06-09 NOTE — ED Provider Notes (Signed)
[facility] EMERGENCY DEPARTMENT Provider Note   CSN: [id number] Arrival date & time: 06/09/20  0932     History Chief Complaint  Patient presents with  . Chest Pain    Alexandria Chapman is a 75 y.o. female.  She is here with a complaint of fatigue its been going on for a few weeks.  She had a sinus infection and was treated with Augmentin.  That is improved but continues with less energy.  Shortness of breath at times not always associated with exertion.  Intermittent chest pressure.  Nausea.  Few episodes of diarrhea.  No fever.  She is Covid vaccinated and boosted.  No sick contacts or recent travel.  She attributes her symptoms to anxiety and stress as her dog is injured and needs surgery.  The history is provided by the patient.  Chest Pain Pain location:  Substernal area Pain quality: pressure   Pain radiates to:  Does not radiate Pain severity:  Mild Onset quality:  Gradual Timing:  Intermittent Progression:  Unchanged Chronicity:  New Relieved by:  None tried Worsened by:  Nothing Ineffective treatments:  None tried Associated symptoms: fatigue, nausea and shortness of breath   Associated symptoms: no abdominal pain, no back pain, no claudication, no cough, no diaphoresis, no dysphagia, no fever, no lower extremity edema, no numbness, no palpitations, no vomiting and no weakness        Past Medical History:  Diagnosis Date  . Anemia    denies  . Anxiety   . Cancer ([place])    RLE squamous cell removed  . Cataract   . Diverticulosis   . GERD (gastroesophageal reflux disease)   . Heart murmur   . IBS (irritable bowel syndrome)   . TMJ disorder involving articular disc abnormality     Patient Active Problem List   Diagnosis Date Noted  . Hx of adenomatous colonic polyps 03/08/2018  . Osteoarthritis of right knee 01/19/2018  . S/P knee replacement 01/19/2018  . Insomnia 11/04/2017  . Anxiety   . Colonic diverticular abscess 11/01/2017  . Diverticulitis  10/29/2017  . Impingement syndrome of right shoulder region 06/16/2017  . Irritable bowel syndrome 02/15/2017  . Gastroesophageal reflux disease 11/10/2016  . Diverticulosis of large intestine without hemorrhage 10/14/2015  . 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 Generalized anxiety disorder 05/08/2015  . 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 ABDOMINAL HYSTERECTOMY    . APPENDECTOMY    . CHOLECYSTECTOMY    . COLONOSCOPY     september 2015 said she has them every 5 years  . CYST EXCISION Right 10/06/2015   Procedure: RIGHT LONG FINGER EXCISION MUCOID CYST;  Surgeon: Leanora Cover, MD;  Location: [facility];  Service: Orthopedics;  Laterality: Right;  Bier block  . ESOPHAGOGASTRODUODENOSCOPY  2007  . KNEE ARTHROSCOPY    . KNEE ARTHROSCOPY Right 06/28/2018  . OOPHORECTOMY    . PAROTID GLAND TUMOR EXCISION Left   . SHOULDER ARTHROSCOPY Right   . TONSILLECTOMY    . TOTAL KNEE ARTHROPLASTY Right 01/19/2018   Procedure: RIGHT TOTAL KNEE ARTHROPLASTY;  Surgeon: Sydnee Cabal, MD;  Location: [facility];  Service: Orthopedics;  Laterality: Right;     OB History   No obstetric history on file.     Family History  Problem Relation Age of Onset  . Alcohol abuse Mother   . Bipolar disorder Mother   . Breast cancer Mother   . Uterine cancer  Mother   . Heart disease Mother   . Alcohol abuse Maternal Uncle   . Drug abuse Cousin   . OCD Other   . Breast cancer Paternal Grandmother   . Breast cancer Cousin        mom side   . Prostate cancer Father   . Cancer Father        tonsils, heavy smoker  . Diabetes Father   . Heart disease Father   . Other Brother        pacemake defibulator-2020  . Colon cancer Neg Hx   . Esophageal cancer Neg Hx     Social History   Tobacco Use  . Smoking status: Former Smoker    Packs/day: 0.25    Years: 2.00    Pack years: 0.50    Types: Cigarettes  . Smokeless tobacco: Never Used   . Tobacco comment: Few cigarettes during her 'teen' years  Vaping Use  . Vaping Use: Never used  Substance Use Topics  . Alcohol use: Yes    Comment: occas  . Drug use: No    Home Medications Prior to Admission medications   Medication Sig Start Date End Date Taking? Authorizing Provider  ALPRAZolam (XANAX) 0.25 MG tablet Take 0.25 mg by mouth at bedtime. 02/24/19   [provider]  AMBULATORY NON FORMULARY MEDICATION 2 capsules daily. Beet Root    [provider]  dicyclomine (BENTYL) 20 MG tablet Take 1 tablet (20 mg total) by mouth 2 (two) times daily as needed for spasms. 12/19/19   Zehr, Laban Emperor, PA-C  docusate sodium (COLACE) 100 MG capsule Take 100 mg by mouth daily.    [provider]  famotidine (PEPCID) 20 MG tablet Take 1 tablet (20 mg total) by mouth 2 (two) times daily. 12/19/19   Zehr, Laban Emperor, PA-C  Multiple Minerals-Vitamins (CAL-MAG-ZINC-D PO) Take 1-2 tablets by mouth daily.  Patient not taking: Reported on 02/10/2020    [provider]  Multiple Vitamin (MULTIVITAMIN WITH MINERALS) TABS tablet Take 2 tablets by mouth daily. Patient not taking: Reported on 02/10/2020    [provider]  ondansetron (ZOFRAN) 4 MG tablet Take 4 mg by mouth as needed for nausea or vomiting. Patient not taking: Reported on 02/10/2020    [provider]  polyethylene glycol powder (GLYCOLAX/MIRALAX) 17 GM/SCOOP powder Take 17 g by mouth as needed.    [provider]  Probiotic Product (ALIGN PO) Take 1 capsule by mouth daily.     [provider]    Allergies    Sulfa antibiotics, Erythromycin, Lexapro [escitalopram oxalate], Nsaids, Pantoprazole sodium, Sucralfate, Voltaren [diclofenac sodium], and Sudafed [pseudoephedrine hcl]  Review of Systems   Review of Systems  Constitutional: Positive for fatigue. Negative for diaphoresis and fever.  HENT: Negative for sore throat and trouble swallowing.   Eyes: Negative  for visual disturbance.  Respiratory: Positive for shortness of breath. Negative for cough.   Cardiovascular: Positive for chest pain. Negative for palpitations and claudication.  Gastrointestinal: Positive for diarrhea and nausea. Negative for abdominal pain and vomiting.  Genitourinary: Negative for dysuria.  Musculoskeletal: Negative for back pain.  Skin: Negative for rash.  Neurological: Negative for weakness and numbness.    Physical Exam Updated Vital Signs BP (!) 171/93 (BP Location: Right Arm)   Pulse 65   Temp 98.3 F (36.8 C)   Ht 5\' 4"  (1.626 m)   Wt 59 kg   SpO2 100%   BMI 22.31 kg/m  Physical Exam Vitals and nursing note reviewed.  Constitutional:      General: She is not in acute distress.    Appearance: Normal appearance. She is well-developed and well-nourished.  HENT:     Head: Normocephalic and atraumatic.  Eyes:     Conjunctiva/sclera: Conjunctivae normal.  Cardiovascular:     Rate and Rhythm: Normal rate and regular rhythm.     Heart sounds: No murmur heard.   Pulmonary:     Effort: Pulmonary effort is normal. No respiratory distress.     Breath sounds: Normal breath sounds.  Abdominal:     Palpations: Abdomen is soft.     Tenderness: There is no abdominal tenderness.  Musculoskeletal:        General: No signs of injury or edema. Normal range of motion.     Cervical back: Neck supple.     Right lower leg: No edema.     Left lower leg: No edema.  Skin:    General: Skin is warm and dry.     Capillary Refill: Capillary refill takes less than 2 seconds.  Neurological:     General: No focal deficit present.     Mental Status: She is alert.     Sensory: No sensory deficit.     Motor: No weakness.  Psychiatric:        Mood and Affect: Mood and affect normal.     ED Results / Procedures / Treatments   Labs (all labs ordered are listed, but only abnormal results are displayed) Labs Reviewed  COMPREHENSIVE METABOLIC PANEL - Abnormal; Notable  for the following components:      Result Value   Sodium 133 (*)    Anion gap 4 (*)    All other components within normal limits  SARS CORONAVIRUS 2 (TAT 6-24 HRS)  CBC  TROPONIN I (HIGH SENSITIVITY)  TROPONIN I (HIGH SENSITIVITY)    EKG EKG Interpretation  Date/Time:  Tuesday June 09 2020 09:21:02 EST Ventricular Rate:  60 PR Interval:    QRS Duration: 94 QT Interval:  425 QTC Calculation: 425 R Axis:   61 Text Interpretation: Sinus rhythm Probable left atrial enlargement No significant change since prior 2/20 Confirmed by Aletta Edouard [phone]) on 06/09/2020 9:23:08 AM   Radiology DG Chest Port 1 View  Result Date: 06/09/2020 CLINICAL DATA:  Chest pain EXAM: PORTABLE CHEST 1 VIEW COMPARISON:  05/28/2018 FINDINGS: The heart size and mediastinal contours are within normal limits. No focal airspace consolidation, pleural effusion, or pneumothorax. The visualized skeletal structures are unremarkable. IMPRESSION: No active disease. Electronically Signed   By: Davina Poke D.O.   On: 06/09/2020 10:14    Procedures Procedures   Medications Ordered in ED Medications - No data to display  ED Course  I have reviewed the triage vital signs and the nursing notes.  Pertinent labs & imaging results that were available during my care of the patient were reviewed by me and considered in my medical decision making (see chart for details).  Clinical Course as of 06/09/20 1714  Tue Jun 09, 2020  1008 Chest x-ray interpreted by me as no acute infiltrates.  Awaiting radiology reading. [MB]  1232 Reviewed work-up with patient and she is comfortable plan for discharge.  She says she actually feels a little bit better and she thinks it might have been anxiety.  Recommended close follow-up with PCP.  Return instructions discussed [MB]    Clinical Course User Index [MB] Hayden Rasmussen, MD  MDM Rules/Calculators/A&P                         Alexandria Chapman was evaluated in  Emergency Department on 06/09/2020 for the symptoms described in the history of present illness. She was evaluated in the context of the global COVID-19 pandemic, which necessitated consideration that the patient might be at risk for infection with the SARS-CoV-2 virus that causes COVID-19. Institutional protocols and algorithms that pertain to the evaluation of patients at risk for COVID-19 are in a state of rapid change based on information released by regulatory bodies including the CDC and federal and state organizations. These policies and algorithms were followed during the patient's care in the ED.  This patient complains of fatigue chest pressure diarrhea; this involves an extensive number of treatment Options and is a complaint that carries with it a high risk of complications and Morbidity. The differential includes pneumonia, bronchitis, Covid, anemia, metabolic derangement, ACS, viral syndrome  I ordered, reviewed and interpreted labs, which included CBC with normal white count normal hemoglobin, chemistries with mildly low sodium otherwise normal chemistries and LFTs, troponins flat, Covid test pending at time of discharge I ordered imaging studies which included chest x-ray and I independently    visualized and interpreted imaging which showed no acute pulmonary disease Previous records obtained and reviewed in epic, no recent admissions  After the interventions stated above, I reevaluated the patient and found patient to be asymptomatic oxygenating 100% on room air.  Reviewed work-up with her.  She is comfortable plan for discharge and follow-up with Covid testing on MyChart.  Return instructions discussed.   Final Clinical Impression(s) / ED Diagnoses Final diagnoses:  Nonspecific chest pain  Fatigue, unspecified type  Person under investigation for COVID-19    Rx / DC Orders ED Discharge Orders    None       Hayden Rasmussen, MD 06/09/20 1721

## 2020-06-09 NOTE — Discharge Instructions (Addendum)
You were seen in the emergency department for evaluation of fatigue and intermittent chest pain.  You had blood work EKG chest x-ray that did not show any serious findings.  Please schedule follow-up with your primary care doctor.  Return to the emergency department for any worsening or concerning symptoms.  You had a Covid test that was pending at time of discharge.  It should result within the next day or so.  Please isolate until your results are back and negative.

## 2020-06-09 NOTE — ED Triage Notes (Signed)
Pt with recent tx sinus infection completed abx a week ago.  Pt states less energy, heaviness upper chest, gets winded with exercise.  Intermittent nausea several days.  Denies nausea/vomiting  Denies fever.

## 2020-06-09 NOTE — ED Notes (Signed)
Family member Elberta Leatherwood going home, available for pickup when patient ready for discharge.  [phone]

## 2020-06-09 NOTE — ED Notes (Signed)
Family member Alexandria Chapman contacted to notify of pending discharge, on his way to pick her up

## 2020-07-17 ENCOUNTER — Other Ambulatory Visit: Payer: Self-pay | Admitting: *Deleted

## 2020-07-17 ENCOUNTER — Ambulatory Visit
Admission: RE | Admit: 2020-07-17 | Discharge: 2020-07-17 | Disposition: A | Discharge status: Home / Self Care | Payer: Self-pay | Source: Ambulatory Visit | Attending: Pulmonary Disease | Admitting: Pulmonary Disease

## 2020-07-17 DIAGNOSIS — R918 Other nonspecific abnormal finding of lung field: Secondary | ICD-10-CM

## 2020-08-14 ENCOUNTER — Telehealth: Payer: Self-pay | Admitting: Gastroenterology

## 2020-08-14 NOTE — Telephone Encounter (Signed)
Patient believes she has a thrombosed hemorrhoid.  She is asking if we can see her and lance it.  I advised her that we don't offer that treatment, but we could see if the doc of the day at [facility] could see her in the next few days.  She has an appointment with her PCP soon and thanked me for the offer, but would prefer to wait to see if they can help her. She has some proctofoam at home she says is helping.  We reviewed sitzbaths and [facility].  She will call me back if she prefers to proceed with possible referral.

## 2020-08-14 NOTE — Telephone Encounter (Signed)
This pt last saw Dr Fuller Plan please send to [facility]. Thanks

## 2020-08-14 NOTE — Telephone Encounter (Signed)
Patient called and said she woke up with a hemorrhoid that she is looking to get lanced please advise.

## 2020-08-14 NOTE — Telephone Encounter (Signed)
PCP will assess, determine treatment and determine if [facility] referral is needed.

## 2020-10-20 IMAGING — DX DG CHEST 2V
2 series · 2 of 2 positions shown · non-contrast
Comparison: None.

CLINICAL DATA: Chest pain.

EXAM:
CHEST - 2 VIEW

[chest pa]
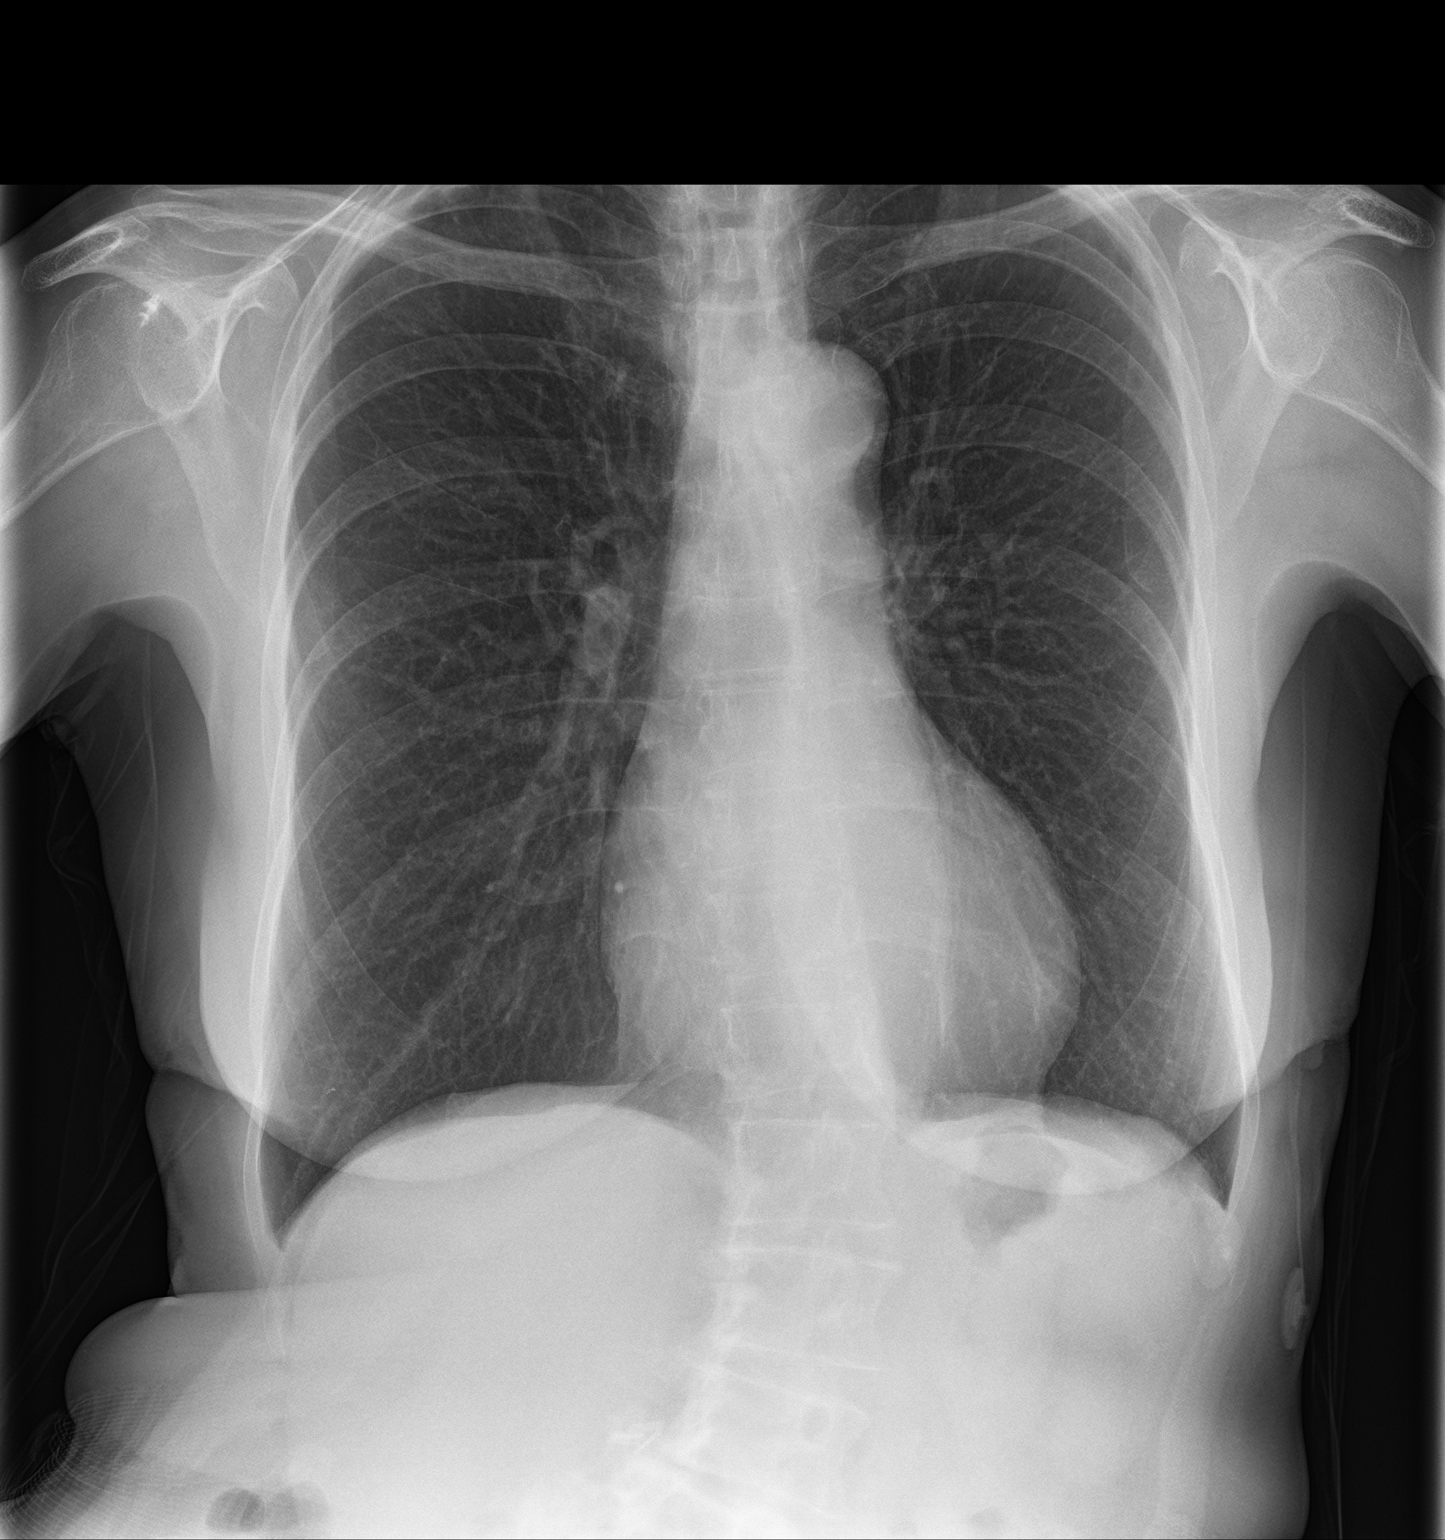

[chest lat]
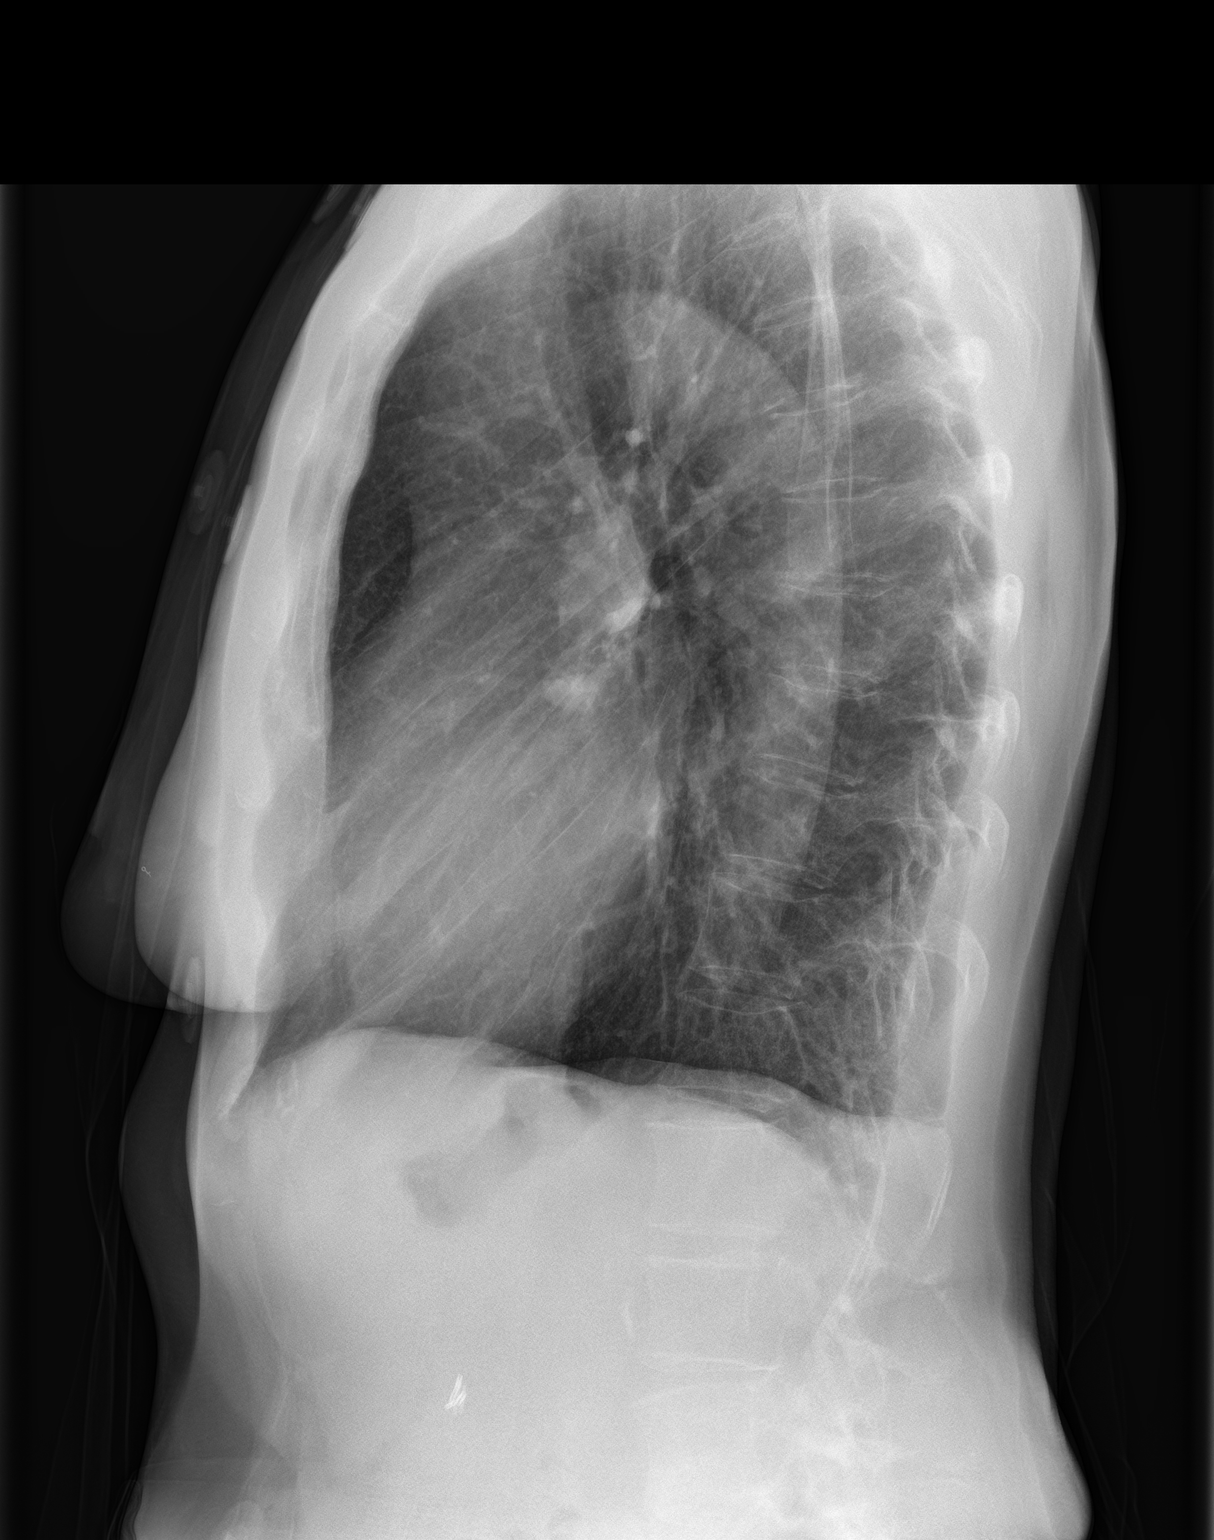

[2 of 2 positions shown; findings below may reference images not displayed]

FINDINGS: The cardiomediastinal silhouette is within normal limits. The lungs
are hyperinflated with mild asymmetric density in the right lung
apex which is slightly nodular. No airspace consolidation, edema,
definite pleural effusion, or pneumothorax is identified. Right
upper quadrant abdominal surgical clips are noted. Lumbar scoliosis
is partially visualized.
IMPRESSION: 1. Hyperinflation without evidence of acute cardiopulmonary process.
2. Mildly nodular density in the right lung apex, possibly
asymmetric scarring although a small underlying neoplasm is not
excluded given lack of prior studies to demonstrate stability.
Consider nonemergent chest CT for further evaluation.

## 2021-07-26 ENCOUNTER — Encounter: Payer: Self-pay | Admitting: Physician Assistant

## 2021-07-26 ENCOUNTER — Ambulatory Visit (INDEPENDENT_AMBULATORY_CARE_PROVIDER_SITE_OTHER): Payer: MEDICARE | Admitting: Physician Assistant

## 2021-07-26 VITALS — BP 122/78 | HR 58 | Ht 65.0 in | Wt 133.0 lb

## 2021-07-26 DIAGNOSIS — R079 Chest pain, unspecified: Secondary | ICD-10-CM

## 2021-07-26 DIAGNOSIS — R0789 Other chest pain: Secondary | ICD-10-CM

## 2021-07-26 DIAGNOSIS — R55 Syncope and collapse: Secondary | ICD-10-CM

## 2021-07-26 DIAGNOSIS — K581 Irritable bowel syndrome with constipation: Secondary | ICD-10-CM

## 2021-07-26 DIAGNOSIS — R0602 Shortness of breath: Secondary | ICD-10-CM

## 2021-07-26 NOTE — Progress Notes (Signed)
Referral to [facility] for SOB  Chest heaviness and syncope x 1  ?

## 2021-07-26 NOTE — Progress Notes (Signed)
? ?Subjective:  ? ? Patient ID: Alexandria Chapman, female    DOB: 08-Sep-1945, 76 y.o.   MRN: [id number] ? ?HPI ?Alexandria Chapman is a pleasant 76 year old white female, established with Dr. Fuller Plan.  She was last seen here in August 2021.  She has history of IBS, diverticular disease and prior diverticular abscess, osteoarthritis, adenomatous colon polyps anxiety and has recently started medication for hypertension. ?She comes in today due to symptoms of recent episodes of what she describes as chest heaviness and shortness of breath.  She relates these episodes have been happening over the past 2 to 3 months and are fairly frequent, almost daily.  On further questioning she says the symptoms usually occur when she is up and around and resolved with rest.  In 2022 she had 1 episode associated with similar symptoms that occurred while she was at church and says she actually passed out.  She has had a few similar episodes over this past year and then a couple of weeks ago had 1 episode at home of presyncope after getting up from the couch. ?She says with some of these episodes she will have some vague sensation of perhaps urge for bowel movement but generally does not have to have a bowel movement, and episodes do not result in diarrhea, nausea or vomiting and no associated abdominal pain. ?She started on Diovan a few weeks ago per her PCP and has had improvement in her blood pressure. ?She is not sure whether she talk to her PCP about all of the above symptoms. ?She did apparently have a cardiac work-up about 10 years ago [place] which was [place].  She says that she does have strong family history of heart disease and atrial fibrillation. ? ?Last colonoscopy and EGD October 2021.  At EGD she had mild diffuse gastritis and a few sessile polyps.  Path showed mild chronic gastritis, fundic gland polyps and mild chronic inflammation of the esophagus, no H. pylori.  At colonoscopy she had 2 small sessile  polyps removed from the rectum which were hyperplastic and noted to have multiple left colon diverticuli with some associated spasm and narrowing at a diverticular opening. ? ?Review of Systems.Pertinent positive and negative review of systems were noted in the above HPI section.  All other review of systems was otherwise negative.  ? ?Outpatient Encounter Medications as of 07/26/2021  ?Medication Sig  ? ALPRAZolam (XANAX) 0.25 MG tablet Take 0.25 mg by mouth at bedtime.  ? AMBULATORY NON FORMULARY MEDICATION 2 capsules daily. Beet Root  ? dicyclomine (BENTYL) 20 MG tablet Take 1 tablet (20 mg total) by mouth 2 (two) times daily as needed for spasms.  ? docusate sodium (COLACE) 100 MG capsule Take 100 mg by mouth daily.  ? Multiple Minerals-Vitamins (CAL-MAG-ZINC-D PO) Take 1-2 tablets by mouth daily.  ? Multiple Vitamin (MULTIVITAMIN WITH MINERALS) TABS tablet Take 2 tablets by mouth daily.  ? polyethylene glycol powder (GLYCOLAX/MIRALAX) 17 GM/SCOOP powder Take 17 g by mouth as needed.  ? valsartan (DIOVAN) 80 MG tablet Take 80 mg by mouth daily.  ? famotidine (PEPCID) 20 MG tablet Take 1 tablet (20 mg total) by mouth 2 (two) times daily.  ? ondansetron (ZOFRAN) 4 MG tablet Take 4 mg by mouth as needed for nausea or vomiting. (Patient not taking: Reported on 02/10/2020)  ? Probiotic Product (ALIGN PO) Take 1 capsule by mouth daily.   ? ?No facility-administered encounter medications on file as of 07/26/2021.  ? ?Allergies  ?  Allergen Reactions  ? Sulfa Antibiotics Anaphylaxis  ? Erythromycin   ?  Bloody stool  ? Lexapro [Escitalopram Oxalate]   ?  "spacey" flu like symptoms   ? Nsaids   ?  Bloody stool, abdominal pain  ? Pantoprazole Sodium Nausea Only  ? Sucralfate   ?  Unknown reaction  ? Voltaren [Diclofenac Sodium] Other (See Comments)  ?  Bloody stool, abdominal pain  ? Sudafed [Pseudoephedrine Hcl] Palpitations and Other (See Comments)  ?  Dizziness, fainting   ? ?Patient Active Problem List  ? Diagnosis Date  Noted  ? Hx of adenomatous colonic polyps 03/08/2018  ? Osteoarthritis of right knee 01/19/2018  ? S/P knee replacement 01/19/2018  ? Insomnia 11/04/2017  ? Anxiety   ? Colonic diverticular abscess 11/01/2017  ? Diverticulitis 10/29/2017  ? Impingement syndrome of right shoulder region 06/16/2017  ? Irritable bowel syndrome 02/15/2017  ? Gastroesophageal reflux disease 11/10/2016  ? Diverticulosis of large intestine without hemorrhage 10/14/2015  ? 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 Generalized anxiety disorder 05/08/2015  ? 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 ?Social History  ? ?Socioeconomic History  ? Marital status: Widowed  ?  Spouse name: Not on file  ? Number of children: Not on file  ? Years of education: Not on file  ? Highest education level: Not on file  ?Occupational History  ? Not on file  ?Tobacco Use  ? Smoking status: Former  ?  Packs/day: 0.25  ?  Years: 2.00  ?  Pack years: 0.50  ?  Types: Cigarettes  ? Smokeless tobacco: Never  ? Tobacco comments:  ?  Few cigarettes during her 'teen' years  ?Vaping Use  ? Vaping Use: Never used  ?Substance and Sexual Activity  ? Alcohol use: Yes  ?  Comment: occas  ? Drug use: No  ? Sexual activity: Never  ?Other Topics Concern  ? Not on file  ?Social History Narrative  ? Not on file  ? ?Social Determinants of Health  ? ?Financial Resource Strain: Not on file  ?Food Insecurity: Not on file  ?Transportation Needs: Not on file  ?Physical Activity: Not on file  ?Stress: Not on file  ?Social Connections: Not on file  ?Intimate Partner Violence: Not on file  ? ? ?Alexandria Chapman's family history includes Alcohol abuse in her maternal uncle and mother; Bipolar disorder in her mother; Breast cancer in her cousin, mother, and paternal grandmother; Cancer in her father; Diabetes in her father; Drug abuse in her cousin; Heart disease in her father and mother; OCD in an other family member; Other in her brother; Prostate cancer in her father; Uterine cancer in her mother. ? ? ?    ?Objective:  ?  ?Vitals:  ? 07/26/21 1001  ?BP: 122/78  ?Pulse: (!) 58  ?SpO2: 98%  ? ? ?Physical Exam Well-developed well-nourished older white female in no acute distress.  Pleasant Weight, 133 BMI 22.1 ? ?HEENT; nontraumatic normocephalic, EOMI, PE R LA, sclera anicteric. ?Oropharynx; not examined today ?Neck; supple, no JVD ?Cardiovascular; regular rate and rhythm with S1-S2, no murmur rub or gallop ?Pulmonary; Clear bilaterally ?Abdomen; soft, nontender, nondistended, no palpable mass or hepatosplenomegaly, bowel sounds are active ?Rectal; not done today ?Skin; benign exam, no jaundice rash or appreciable lesions ?Extremities; no clubbing cyanosis or edema skin warm and dry ?Neuro/Psych; alert and oriented x4, grossly nonfocal mood and affect appropriate  ? ? ? ?   ?Assessment & Plan:  ? ?#53 76 year old white female with 2  to 21-monthhistory of frequent episodes of chest heaviness and shortness of breath usually occurring when she is up, and resolving with rest. ?Patient had an episode of syncope a couple of weeks ago which occurred after his standing from a sitting position. ?Also relates an episode a couple of years ago of syncope while she was in church after she had had an episode of chest heaviness and shortness of breath. ? ?Occasionally with these recent episodes she will have a vague sense of potential need for a bowel movement, however none of these episodes have resulted in a bowel movement, or any abdominal pain diarrhea etc. ? ?I do not think that these symptoms are of GI etiology and I am concerned about cardiac disease, angina. ? ?#2 hypertension-new diagnosis just started Diovan ?#3 history of adenomatous colon polyps-up-to-date with colonoscopy last done October 2021 -2 hyperplastic polyps ?#4 diverticulosis and prior history of diverticulitis with abscess ?#5.  IBS ?#6.  Mild GERD on OTC famotidine ?#7 anxiety ?#8 osteoarthritis ? ?[facility]. ?Further recommendations pending results of cardiac work-up ? ?Esti Demello S Isabella Roemmich PA-C ?07/26/2021 ? ? ?Cc: AVicenta Aly FNP ?  ?

## 2021-07-26 NOTE — Patient Instructions (Signed)
Referral placed  today to [facility] for SOB Chest heaviness and syncope x 1  ? ?If you are age 75 or older, your body mass index should be between 23-30. Your Body mass index is 22.13 kg/m?Marland Kitchen If this is out of the aforementioned range listed, please consider follow up with your Primary Care Provider. ? ?If you are age 40 or younger, your body mass index should be between 19-25. Your Body mass index is 22.13 kg/m?Marland Kitchen If this is out of the aformentioned range listed, please consider follow up with your Primary Care Provider.  ? ?________________________________________________________ ? ?The [facility] providers would like to encourage you to use [facility] to communicate with providers for non-urgent requests or questions.  Due to long hold times on the telephone, sending your provider a message by [facility] may be a faster and more efficient way to get a response.  Please allow 48 business hours for a response.  Please remember that this is for non-urgent requests.  ?_______________________________________________________  ? ?I appreciate the  opportunity to care for you ? ?Thank You  ? ?Amy Esterwood,PA-C  ?

## 2021-08-09 NOTE — Progress Notes (Signed)
SCARDIOLOGY CONSULT NOTE  ? ? ? ? ? ?Patient ID: ?Alexandria Chapman ?MRN: [id number] ?DOB/AGE: 1945/08/13 76 y.o. ? ?Admit date: (Not on file) ?Referring Physician: [facility]  ?Primary Physician: Vicenta Aly, [place] ?Primary Cardiologist: New ?Reason for Consultation: Chest pain ? ?Active Problems: ?  * No active hospital problems. * ? ? ?HPI:  76 y.o. referred by [facility] PA  for chest pain. History of anxiety , anemia , GERD/IBS and heart murmur Seen by GI 07/26/21 with chest heaviness and dyspnea over last 3 months almost daily Improved with rest Had similar episode in 2022 at church and passed out Can have urge to have BM with some episodes BP improved with use of Diovan Saw cardiologist 10 years ago in different state and was [facility] She has had EGD/colon with chronic gastritis and polyps removed  ? ?She denies chest pain and says its tightness.  She thought it was GERD but not related to food ?Does get tightness and dyspnea with exertion. Has been occurring for last few months no rest ?Pain ? ?Shared decision making feel cardiac CTA best test to risk stratify ?SL nitro discussed prescription and use  ? ?ROS ?All other systems reviewed and negative except as noted above ? ?Past Medical History:  ?Diagnosis Date  ? Anemia   ? denies  ? Anxiety   ? Cancer [facility])   ? RLE squamous cell removed  ? Cataract   ? Diverticulosis   ? GERD (gastroesophageal reflux disease)   ? Heart murmur   ? IBS (irritable bowel syndrome)   ? TMJ disorder involving articular disc abnormality   ?  ?Family History  ?Problem Relation Age of Onset  ? Alcohol abuse Mother   ? Bipolar disorder Mother   ? Breast cancer Mother   ? Uterine cancer Mother   ? Heart disease Mother   ? Alcohol abuse Maternal Uncle   ? Drug abuse Cousin   ? OCD Other   ? Breast cancer Paternal Grandmother   ? Breast cancer Cousin   ?     mom side   ? Prostate cancer Father   ? Cancer Father   ?     tonsils, heavy smoker  ? Diabetes Father   ? Heart disease Father   ?  Other Brother   ?     pacemake defibulator-2020  ? Colon cancer Neg Hx   ? Esophageal cancer Neg Hx   ?  ?Social History  ? ?Socioeconomic History  ? Marital status: Widowed  ?  Spouse name: Not on file  ? Number of children: Not on file  ? Years of education: Not on file  ? Highest education level: Not on file  ?Occupational History  ? Not on file  ?Tobacco Use  ? Smoking status: Former  ?  Packs/day: 0.25  ?  Years: 2.00  ?  Pack years: 0.50  ?  Types: Cigarettes  ? Smokeless tobacco: Never  ? Tobacco comments:  ?  Few cigarettes during her 'teen' years  ?Vaping Use  ? Vaping Use: Never used  ?Substance and Sexual Activity  ? Alcohol use: Yes  ?  Comment: occas  ? Drug use: No  ? Sexual activity: Never  ?Other Topics Concern  ? Not on file  ?Social History Narrative  ? Not on file  ? ?Social Determinants of Health  ? ?Financial Resource Strain: Not on file  ?Food Insecurity: Not on file  ?Transportation Needs: Not on file  ?Physical Activity: Not  on file  ?Stress: Not on file  ?Social Connections: Not on file  ?Intimate Partner Violence: Not on file  ?  ?Past Surgical History:  ?Procedure Laterality Date  ? ABDOMINAL HYSTERECTOMY    ? APPENDECTOMY    ? CHOLECYSTECTOMY    ? COLONOSCOPY    ? september 2015 said she has them every 5 years  ? CYST EXCISION Right 10/06/2015  ? Procedure: RIGHT LONG FINGER EXCISION MUCOID CYST;  Surgeon: Leanora Cover, MD;  Location: [facility];  Service: Orthopedics;  Laterality: Right;  Bier block  ? ESOPHAGOGASTRODUODENOSCOPY  2007  ? EYE SURGERY Bilateral 2022  ? KNEE ARTHROSCOPY    ? KNEE ARTHROSCOPY Right 06/28/2018  ? OOPHORECTOMY    ? PAROTID GLAND TUMOR EXCISION Left   ? SHOULDER ARTHROSCOPY Right   ? TONSILLECTOMY    ? TOTAL KNEE ARTHROPLASTY Right 01/19/2018  ? Procedure: RIGHT TOTAL KNEE ARTHROPLASTY;  Surgeon: Sydnee Cabal, MD;  Location: [facility];  Service: Orthopedics;  Laterality: Right;  ?  ? ? ?Current Outpatient Medications:  ?  ALPRAZolam (XANAX) 0.25  MG tablet, Take 0.25 mg by mouth at bedtime., Disp: , Rfl:  ?  AMBULATORY NON FORMULARY MEDICATION, 2 capsules daily. Beet Root, Disp: , Rfl:  ?  dicyclomine (BENTYL) 20 MG tablet, Take 1 tablet (20 mg total) by mouth 2 (two) times daily as needed for spasms., Disp: 60 tablet, Rfl: 3 ?  docusate sodium (COLACE) 100 MG capsule, Take 100 mg by mouth daily., Disp: , Rfl:  ?  Multiple Minerals-Vitamins (CAL-MAG-ZINC-D PO), Take 1-2 tablets by mouth daily., Disp: , Rfl:  ?  Multiple Vitamin (MULTIVITAMIN WITH MINERALS) TABS tablet, Take 2 tablets by mouth daily., Disp: , Rfl:  ?  polyethylene glycol powder (GLYCOLAX/MIRALAX) 17 GM/SCOOP powder, Take 17 g by mouth as needed., Disp: , Rfl:  ?  famotidine (PEPCID) 20 MG tablet, Take 1 tablet (20 mg total) by mouth 2 (two) times daily. (Patient not taking: Reported on 08/13/2021), Disp: 60 tablet, Rfl: 3 ?  ondansetron (ZOFRAN) 4 MG tablet, Take 4 mg by mouth as needed for nausea or vomiting. (Patient not taking: Reported on 02/10/2020), Disp: , Rfl:  ?  Probiotic Product (ALIGN PO), Take 1 capsule by mouth daily.  (Patient not taking: Reported on 08/13/2021), Disp: , Rfl:  ?  valsartan (DIOVAN) 80 MG tablet, Take 80 mg by mouth daily. (Patient not taking: Reported on 08/13/2021), Disp: , Rfl:  ? ? ? ?Physical Exam: ?Blood pressure (!) 142/88, pulse 63, height 5' 5.5" (1.664 m), weight 129 lb (58.5 kg), SpO2 97 %.   ? ?Affect appropriate ?Healthy:  appears stated age ?HEENT: normal ?Neck supple with no adenopathy ?JVP normal no bruits no thyromegaly ?Lungs clear with no wheezing and good diaphragmatic motion ?Heart:  S1/S2 no murmur, no rub, gallop or click ?PMI normal ?Abdomen: benighn, BS positve, no tenderness, no AAA ?no bruit.  No HSM or HJR ?Distal pulses intact with no bruits ?No edema ?Neuro non-focal ?Skin warm and dry ?No muscular weakness ? ? ?Labs: ?  ?Lab Results  ?Component Value Date  ? WBC 5.3 06/09/2020  ? HGB 13.3 06/09/2020  ? HCT 40.6 06/09/2020  ? MCV 86.0  06/09/2020  ? PLT 289 06/09/2020  ? No results for input(s): NA, K, CL, CO2, BUN, CREATININE, CALCIUM, PROT, BILITOT, ALKPHOS, ALT, AST, GLUCOSE in the last 168 hours. ? ?Invalid input(s): LABALBU ?Lab Results  ?Component Value Date  ? TROPONINI <0.03 01/21/2018  ? No  results found for: CHOL ?No results found for: HDL ?No results found for: [place] ?No results found for: TRIG ?No results found for: CHOLHDL ?No results found for: LDLDIRECT  ?  ?Radiology: ?No results found. ? ?EKG: SR rate 60 LAD otherwise normal 06/09/20    ? ? ?ASSESSMENT AND PLAN:  ? ?Chest Pain:  somewhat worrisome anginal sounding Shared decision making favor cardiac CTA with calcium score and FFR to risk stratify  SL nitro called in   ?HTN:  continue ARB low sodium DASH diet ?GI:  improve on pepcid GERD/IBS ?Murmur:  history of heart murmur  no significant on on exam today  ? ? ?Cardiac CTA ?Labs ?Lopressor 100 mg 2 hours before ? ? ?F/U post CTA suspect she will need heart catheterization  ? ?Signed: ?Jenkins Rouge ?08/13/2021, 4:13 PM ? ? ?

## 2021-08-13 ENCOUNTER — Ambulatory Visit (INDEPENDENT_AMBULATORY_CARE_PROVIDER_SITE_OTHER): Payer: MEDICARE | Admitting: Cardiovascular Disease

## 2021-08-13 ENCOUNTER — Encounter: Payer: Self-pay | Admitting: Cardiovascular Disease

## 2021-08-13 VITALS — BP 142/88 | HR 63 | Ht 65.5 in | Wt 129.0 lb

## 2021-08-13 DIAGNOSIS — R072 Precordial pain: Secondary | ICD-10-CM

## 2021-08-13 DIAGNOSIS — I10 Essential (primary) hypertension: Secondary | ICD-10-CM | POA: Diagnosis not present

## 2021-08-13 DIAGNOSIS — K219 Gastro-esophageal reflux disease without esophagitis: Secondary | ICD-10-CM

## 2021-08-13 MED ORDER — METOPROLOL TARTRATE 100 MG PO TABS: ORAL_TABLET | ORAL | 0 refills | Status: DC

## 2021-08-13 MED ORDER — NITROGLYCERIN 0.4 MG SL SUBL: 0.4000 mg | SUBLINGUAL_TABLET | SUBLINGUAL | 3 refills | Status: DC | PRN

## 2021-08-13 NOTE — Patient Instructions (Signed)
Medication Instructions:  ?Your physician recommends that you continue on your current medications as directed. Please refer to the Current Medication list given to you today. ? ?*If you need a refill on your cardiac medications before your next appointment, please call your pharmacy* ? ?Lab Work: ?Your physician recommends that you have lab work today- BMET ? ?If you have labs (blood work) drawn today and your tests are completely normal, you will receive your results only by: ?MyChart Message (if you have MyChart) OR ?A paper copy in the mail ?If you have any lab test that is abnormal or we need to change your treatment, we will call you to review the results. ? ? ?Testing/Procedures: ?Your physician has requested that you have cardiac CT. Cardiac computed tomography (CT) is a painless test that uses an x-ray machine to take clear, detailed pictures of your heart. For further information please visit [email or web address]. Please follow instruction sheet as given. ? ?Follow-Up: ?At [facility], you and your health needs are our priority.  As part of our continuing mission to provide you with exceptional heart care, we have created designated Provider Care Teams.  These Care Teams include your primary Cardiologist (physician) and Advanced Practice Providers (APPs -  Physician Assistants and Nurse Practitioners) who all work together to provide you with the care you need, when you need it. ? ?We recommend signing up for the patient portal called "MyChart".  Sign up information is provided on this After Visit Summary.  MyChart is used to connect with patients for Virtual Visits (Telemedicine).  Patients are able to view lab/test results, encounter notes, upcoming appointments, etc.  Non-urgent messages can be sent to your provider as well.   ?To learn more about what you can do with MyChart, go to [email or web address].   ? ?Your next appointment:   ?3 month(s) ? ?The format for your next appointment:   ?In  Person ? ?Provider:   ?Jenkins Rouge, MD { ? ?Other Instructions ? ? ?Your cardiac CT will be scheduled at one of the below locations:  ? ?[facility] ?[address] ?[place], [place] [address] ?[phone] ? ?If scheduled at [facility], please arrive at the [facility] and Children's Entrance (Entrance C2) of [facility] 30 minutes prior to test start time. ?You can use the FREE valet parking offered at entrance C (encouraged to control the heart rate for the test)  ?Proceed to the [facility] Radiology Department (first floor) to check-in and test prep. ? ?All radiology patients and guests should use entrance C2 at [facility], accessed from [facility], even though the hospital's physical address listed is [address]. ? ? ? ? ?Please follow these instructions carefully (unless otherwise directed): ? ? ?On the Night Before the Test: ?Be sure to Drink plenty of water. ?Do not consume any caffeinated/decaffeinated beverages or chocolate 12 hours prior to your test. ?Do not take any antihistamines 12 hours prior to your test. ? ?On the Day of the Test: ?Drink plenty of water until 1 hour prior to the test. ?Do not eat any food 4 hours prior to the test. ?You may take your regular medications prior to the test.  ?Take metoprolol (Lopressor) 100 mg two hours prior to test. ?FEMALES- please wear underwire-free bra if available, avoid dresses & tight clothing ? ?After the Test: ?Drink plenty of water. ?After receiving IV contrast, you may experience a mild flushed feeling. This is normal. ?On occasion, you may  experience a mild rash up to 24 hours after the test. This is not dangerous. If this occurs, you can take Benadryl 25 mg and increase your fluid intake. ?If you experience trouble breathing, this can be serious. If it is severe call 911 IMMEDIATELY. If it is mild, please call our office. ? ? ?We will call to schedule your test 2-4 weeks out understanding  that some insurance companies will need an authorization prior to the service being performed.  ? ?For non-scheduling related questions, please contact the cardiac imaging nurse navigator should you have any questions/concerns: ?Marchia Bond, Cardiac Imaging Nurse Navigator ?Gordy Clement, Cardiac Imaging Nurse Navigator ?[facility] ?Direct Office Dial: [phone]  ? ?For scheduling needs, including cancellations and rescheduling, please call [place], [phone].  ? ?Important Information About Sugar ? ? ? ? ?  ?

## 2021-08-14 LAB — BASIC METABOLIC PANEL
BUN/Creatinine Ratio: 16 (ref 12–28)
BUN: 14 mg/dL (ref 8–27)
CO2: 24 mmol/L (ref 20–29)
Calcium: 9.3 mg/dL (ref 8.7–10.3)
Chloride: 98 mmol/L (ref 96–106)
Creatinine, Ser: 0.87 mg/dL (ref 0.57–1.00)
Glucose: 112 mg/dL — ABNORMAL HIGH (ref 70–99)
Potassium: 4 mmol/L (ref 3.5–5.2)
Sodium: 137 mmol/L (ref 134–144)
eGFR: 70 mL/min/{1.73_m2} (ref >59)

## 2021-08-16 ENCOUNTER — Telehealth: Payer: Self-pay | Admitting: Cardiovascular Disease

## 2021-08-16 NOTE — Telephone Encounter (Signed)
Patient is calling requesting her OV notes from her NP appt be sent to her PCP that is listed in her chart.   ?

## 2021-08-16 NOTE — Telephone Encounter (Signed)
Called patient to let her know a copy of the note was sent to her PCP when Dr. Johnsie Cancel closed his note. She stated PCP did not receive note. Will resend office note to patient's PCP. ?

## 2021-08-30 ENCOUNTER — Telehealth (HOSPITAL_COMMUNITY): Payer: Self-pay | Admitting: Emergency Medicine

## 2021-08-30 NOTE — Telephone Encounter (Signed)
Reaching out to patient to offer assistance regarding upcoming cardiac imaging study; pt verbalizes understanding of appt date/time, parking situation and where to check in, pre-test NPO status and medications ordered, and verified current allergies; name and call back number provided for further questions should they arise ?Marchia Bond RN Navigator Cardiac Imaging ?[facility] ?[phone] office ?[phone] cell ? ?Left arm preferred ?'100mg'$  metoprolol tart ?Arrival 830 ? ?

## 2021-08-31 ENCOUNTER — Ambulatory Visit (HOSPITAL_COMMUNITY)
Admission: RE | Admit: 2021-08-31 | Discharge: 2021-08-31 | Disposition: A | Discharge status: Home / Self Care | Payer: MEDICARE | Source: Ambulatory Visit | Attending: Cardiovascular Disease | Admitting: Cardiovascular Disease

## 2021-08-31 DIAGNOSIS — R072 Precordial pain: Secondary | ICD-10-CM | POA: Insufficient documentation

## 2021-08-31 MED ORDER — NITROGLYCERIN 0.4 MG SL SUBL
0.8000 mg | SUBLINGUAL_TABLET | Freq: Once | SUBLINGUAL | Status: AC
  Administered 2021-08-31: 0.8 mg via SUBLINGUAL

## 2021-08-31 MED ORDER — IOHEXOL 350 MG/ML SOLN
100.0000 mL | Freq: Once | INTRAVENOUS | Status: AC | PRN
  Administered 2021-08-31: 100 mL via INTRAVENOUS

## 2021-08-31 MED ORDER — NITROGLYCERIN 0.4 MG SL SUBL
SUBLINGUAL_TABLET | SUBLINGUAL | Status: AC
  Filled 2021-08-31: qty 2

## 2021-09-07 ENCOUNTER — Telehealth: Payer: Self-pay | Admitting: Cardiovascular Disease

## 2021-09-07 NOTE — Telephone Encounter (Signed)
    Pt is returning call to get CT result 

## 2021-09-07 NOTE — Telephone Encounter (Signed)
Per Dr. Johnsie Cancel, Calcium score 0 great no significant blockages. ? ?Left message for patient to call back. ?

## 2021-11-05 NOTE — Progress Notes (Signed)
SCARDIOLOGY CONSULT NOTE       Patient ID: Alexandria Chapman MRN: [id number] DOB/AGE: 75/31/1947 75 y.o.  Admit date: (Not on file) Referring Physician: [place]  Primary Physician: Vicenta Aly, [place] Primary Cardiologist: Johnsie Cancel Reason for Consultation: Chest pain   HPI:  76 y.o. referred by [facility] PA  for chest pain First seen on 08/13/21 . History of anxiety , anemia , GERD/IBS and heart murmur Seen by GI 07/26/21 with chest heaviness and dyspnea over last 3 months almost daily Improved with rest Had similar episode in 2022 at church and passed out Can have urge to have BM with some episodes BP improved with use of Diovan Saw cardiologist 10 years ago in different state and was [facility] She has had EGD/colon with chronic gastritis and polyps removed   She denies chest pain and says its tightness.  She thought it was GERD but not related to food Does get tightness and dyspnea with exertion. Has been occurring for last few months no rest Pain  Cardiac CTA done 08/31/21 Calcium score 0 < 25% soft plaque in ostial RCA no obstructive CAD CAD RADS 1  Lost her puppy of 11 years will get a new one in [place]  ROS All other systems reviewed and negative except as noted above  Past Medical History:  Diagnosis Date   Anemia    denies   Anxiety    Cancer ([place])    RLE squamous cell removed   Cataract    Diverticulosis    GERD (gastroesophageal reflux disease)    Heart murmur    IBS (irritable bowel syndrome)    TMJ disorder involving articular disc abnormality     Family History  Problem Relation Age of Onset   Alcohol abuse Mother    Bipolar disorder Mother    Breast cancer Mother    Uterine cancer Mother    Heart disease Mother    Alcohol abuse Maternal Uncle    Drug abuse Cousin    OCD Other    Breast cancer Paternal Grandmother    Breast cancer Cousin        mom side    Prostate cancer Father    Cancer Father        tonsils, heavy smoker   Diabetes Father    Heart  disease Father    Other Brother        pacemake defibulator-2020   Colon cancer Neg Hx    Esophageal cancer Neg Hx     Social History   Socioeconomic History   Marital status: Widowed    Spouse name: Not on file   Number of children: Not on file   Years of education: Not on file   Highest education level: Not on file  Occupational History   Not on file  Tobacco Use   Smoking status: Former    Packs/day: 0.25    Years: 2.00    Total pack years: 0.50    Types: Cigarettes   Smokeless tobacco: Never   Tobacco comments:    Few cigarettes during her 'teen' years  Vaping Use   Vaping Use: Never used  Substance and Sexual Activity   Alcohol use: Yes    Comment: occas   Drug use: No   Sexual activity: Never  Other Topics Concern   Not on file  Social History Narrative   Not on file   Social Determinants of Health   Financial Resource Strain: Not on file  Food Insecurity: Not on  file  Transportation Needs: Not on file  Physical Activity: Not on file  Stress: Not on file  Social Connections: Not on file  Intimate Partner Violence: Not on file    Past Surgical History:  Procedure Laterality Date   ABDOMINAL HYSTERECTOMY     APPENDECTOMY     CHOLECYSTECTOMY     COLONOSCOPY     september 2015 said she has them every 5 years   CYST EXCISION Right 10/06/2015   Procedure: RIGHT LONG FINGER EXCISION MUCOID CYST;  Surgeon: Leanora Cover, MD;  Location: [facility];  Service: Orthopedics;  Laterality: Right;  Bier block   ESOPHAGOGASTRODUODENOSCOPY  2007   EYE SURGERY Bilateral 2022   KNEE ARTHROSCOPY     KNEE ARTHROSCOPY Right 06/28/2018   OOPHORECTOMY     PAROTID GLAND TUMOR EXCISION Left    SHOULDER ARTHROSCOPY Right    TONSILLECTOMY     TOTAL KNEE ARTHROPLASTY Right 01/19/2018   Procedure: RIGHT TOTAL KNEE ARTHROPLASTY;  Surgeon: Sydnee Cabal, MD;  Location: [facility];  Service: Orthopedics;  Laterality: Right;      Current Outpatient Medications:     ALPRAZolam (XANAX) 0.25 MG tablet, Take 0.25 mg by mouth at bedtime., Disp: , Rfl:    AMBULATORY NON FORMULARY MEDICATION, 2 capsules daily. Beet Root, Disp: , Rfl:    dicyclomine (BENTYL) 20 MG tablet, Take 1 tablet (20 mg total) by mouth 2 (two) times daily as needed for spasms., Disp: 60 tablet, Rfl: 3   docusate sodium (COLACE) 100 MG capsule, Take 100 mg by mouth daily., Disp: , Rfl:    metoprolol tartrate (LOPRESSOR) 100 MG tablet, Please take one tablet by mouth 2 hours prior to CT, Disp: 1 tablet, Rfl: 0   Multiple Minerals-Vitamins (CAL-MAG-ZINC-D PO), Take 1-2 tablets by mouth daily., Disp: , Rfl:    Multiple Vitamin (MULTIVITAMIN WITH MINERALS) TABS tablet, Take 2 tablets by mouth daily., Disp: , Rfl:    nitroGLYCERIN (NITROSTAT) 0.4 MG SL tablet, Place 1 tablet (0.4 mg total) under the tongue every 5 (five) minutes as needed for chest pain., Disp: 25 tablet, Rfl: 3   polyethylene glycol powder (GLYCOLAX/MIRALAX) 17 GM/SCOOP powder, Take 17 g by mouth as needed., Disp: , Rfl:    valsartan (DIOVAN) 80 MG tablet, Take 80 mg by mouth daily., Disp: , Rfl:    famotidine (PEPCID) 20 MG tablet, Take 1 tablet (20 mg total) by mouth 2 (two) times daily. (Patient not taking: Reported on 08/13/2021), Disp: 60 tablet, Rfl: 3   ondansetron (ZOFRAN) 4 MG tablet, Take 4 mg by mouth as needed for nausea or vomiting. (Patient not taking: Reported on 02/10/2020), Disp: , Rfl:    Probiotic Product (ALIGN PO), Take 1 capsule by mouth daily.  (Patient not taking: Reported on 08/13/2021), Disp: , Rfl:     Physical Exam: Blood pressure 132/78, pulse 65, height 5' 5.5" (1.664 m), weight 127 lb (57.6 kg), SpO2 94 %.    Affect appropriate Healthy:  appears stated age 76: normal Neck supple with no adenopathy JVP normal no bruits no thyromegaly Lungs clear with no wheezing and good diaphragmatic motion Heart:  S1/S2 no murmur, no rub, gallop or click PMI normal Abdomen: benighn, BS positve, no  tenderness, no AAA no bruit.  No HSM or HJR Distal pulses intact with no bruits No edema Neuro non-focal Skin warm and dry No muscular weakness   Labs:   Lab Results  Component Value Date   WBC 5.3 06/09/2020   HGB  13.3 06/09/2020   HCT 40.6 06/09/2020   MCV 86.0 06/09/2020   PLT 289 06/09/2020   No results for input(s): "NA", "K", "CL", "CO2", "BUN", "CREATININE", "CALCIUM", "PROT", "BILITOT", "ALKPHOS", "ALT", "AST", "GLUCOSE" in the last 168 hours.  Invalid input(s): "LABALBU" Lab Results  Component Value Date   TROPONINI <0.03 01/21/2018   No results found for: "CHOL" No results found for: "HDL" No results found for: "[place]" No results found for: "TRIG" No results found for: "CHOLHDL" No results found for: "LDLDIRECT"    Radiology: No results found.  EKG: SR rate 60 LAD otherwise normal 06/09/20      ASSESSMENT AND PLAN:   Chest Pain:  essentially normal cardiac CTA 08/31/21 calcium score 0 suggests non cardiac Normal aorta and no other significant findings in chest on radiology over read  HTN:  continue ARB low sodium DASH diet GI:  improve on pepcid GERD/IBS Murmur:  history of heart murmur  no significant on exam today TTE 2019 with just trivial AR and mild MR    F/U PRN   Signed: Jenkins Rouge 11/12/2021, 10:25 AM

## 2021-11-12 ENCOUNTER — Encounter: Payer: Self-pay | Admitting: Cardiovascular Disease

## 2021-11-12 ENCOUNTER — Ambulatory Visit (INDEPENDENT_AMBULATORY_CARE_PROVIDER_SITE_OTHER): Payer: MEDICARE | Admitting: Cardiovascular Disease

## 2021-11-12 VITALS — BP 132/78 | HR 65 | Ht 65.5 in | Wt 127.0 lb

## 2021-11-12 DIAGNOSIS — R072 Precordial pain: Secondary | ICD-10-CM | POA: Diagnosis not present

## 2021-11-12 DIAGNOSIS — I10 Essential (primary) hypertension: Secondary | ICD-10-CM

## 2021-11-12 DIAGNOSIS — K219 Gastro-esophageal reflux disease without esophagitis: Secondary | ICD-10-CM

## 2021-11-12 NOTE — Patient Instructions (Signed)
Medication Instructions:  Your physician recommends that you continue on your current medications as directed. Please refer to the Current Medication list given to you today.  *If you need a refill on your cardiac medications before your next appointment, please call your pharmacy*  Lab Work: If you have labs (blood work) drawn today and your tests are completely normal, you will receive your results only by: Ali Chuk (if you have MyChart) OR A paper copy in the mail If you have any lab test that is abnormal or we need to change your treatment, we will call you to review the results.   Follow-Up: At [facility], you and your health needs are our priority.  As part of our continuing mission to provide you with exceptional heart care, we have created designated Provider Care Teams.  These Care Teams include your primary Cardiologist (physician) and Advanced Practice Providers (APPs -  Physician Assistants and Nurse Practitioners) who all work together to provide you with the care you need, when you need it.  We recommend signing up for the patient portal called "MyChart".  Sign up information is provided on this After Visit Summary.  MyChart is used to connect with patients for Virtual Visits (Telemedicine).  Patients are able to view lab/test results, encounter notes, upcoming appointments, etc.  Non-urgent messages can be sent to your provider as well.   To learn more about what you can do with MyChart, go to [email or web address].    Your next appointment:   As needed  The format for your next appointment:   In Person  Provider:   Jenkins Rouge, MD {    Important Information About Sugar

## 2022-05-05 ENCOUNTER — Other Ambulatory Visit: Payer: Self-pay

## 2022-05-05 ENCOUNTER — Other Ambulatory Visit (INDEPENDENT_AMBULATORY_CARE_PROVIDER_SITE_OTHER): Payer: MEDICARE

## 2022-05-05 ENCOUNTER — Ambulatory Visit (INDEPENDENT_AMBULATORY_CARE_PROVIDER_SITE_OTHER): Payer: MEDICARE | Admitting: Physician Assistant

## 2022-05-05 ENCOUNTER — Encounter: Payer: Self-pay | Admitting: Physician Assistant

## 2022-05-05 VITALS — BP 110/70 | HR 66 | Ht 65.0 in | Wt 131.2 lb

## 2022-05-05 DIAGNOSIS — K644 Residual hemorrhoidal skin tags: Secondary | ICD-10-CM

## 2022-05-05 DIAGNOSIS — R110 Nausea: Secondary | ICD-10-CM | POA: Diagnosis not present

## 2022-05-05 DIAGNOSIS — R531 Weakness: Secondary | ICD-10-CM

## 2022-05-05 DIAGNOSIS — R1084 Generalized abdominal pain: Secondary | ICD-10-CM | POA: Diagnosis not present

## 2022-05-05 DIAGNOSIS — R197 Diarrhea, unspecified: Secondary | ICD-10-CM

## 2022-05-05 LAB — CBC WITH DIFFERENTIAL/PLATELET
Basophils Absolute: 0 10*3/uL (ref 0.0–0.1)
Basophils Relative: 0.3 % (ref 0.0–3.0)
Eosinophils Absolute: 0.1 10*3/uL (ref 0.0–0.7)
Eosinophils Relative: 1.4 % (ref 0.0–5.0)
HCT: 41.6 % (ref 36.0–46.0)
Hemoglobin: 13.7 g/dL (ref 12.0–15.0)
Lymphocytes Relative: 21.3 % (ref 12.0–46.0)
Lymphs Abs: 1.5 10*3/uL (ref 0.7–4.0)
MCHC: 33 g/dL (ref 30.0–36.0)
MCV: 86.3 fl (ref 78.0–100.0)
Monocytes Absolute: 0.8 10*3/uL (ref 0.1–1.0)
Monocytes Relative: 12.2 % — ABNORMAL HIGH (ref 3.0–12.0)
Neutro Abs: 4.4 10*3/uL (ref 1.4–7.7)
Neutrophils Relative %: 64.8 % (ref 43.0–77.0)
Platelets: 269 10*3/uL (ref 150.0–400.0)
RBC: 4.82 Mil/uL (ref 3.87–5.11)
RDW: 13.1 % (ref 11.5–15.5)
WBC: 6.8 10*3/uL (ref 4.0–10.5)

## 2022-05-05 LAB — SEDIMENTATION RATE: Sed Rate: 49 mm/hr — ABNORMAL HIGH (ref 0–30)

## 2022-05-05 NOTE — Progress Notes (Signed)
Subjective:    Patient ID: Alexandria Chapman, female    DOB: 12-18-1945, 77 y.o.   MRN: [id number]  HPI Alexandria Chapman is a pleasant 77 year old white female, established with Dr. Fuller Plan who was last seen in the office by myself in April 2023. She has history of IBS, diverticular disease with previous diverticulitis, GERD, and history of adenomatous colon polyps. She comes in today with complaints of onset of diarrhea, abdominal discomfort, nausea and fatigue after she had taken a very short course of metronidazole and topical form for rosacea.  She says she has been intolerant to metronidazole orally in the past.  She discontinued it but her symptoms have persisted.  Initially she was having at least 5-6 liquid bowel movements per day urgent nonbloody no fever or chills.  At this point she continues to have usually 2 loose bowel movements per day.  She is not having any vomiting but continues to feel nauseated and sometimes feels better after eating.  Continues to feel sore and uncomfortable in her abdomen.  She says she "feels awful" with significant sense of exhaustion. She also mentions that she has an inflamed external hemorrhoid from all of the diarrhea.  Has been using an old prescription of hydrocortisone 2.5% She does have very remote history of C. difficile.\  Last colonoscopy October 2021 with 2 small sessile polyps removed from the rectum both were hyperplastic and noted to have multiple left colon diverticuli, no polyps. EGD in October 2021 with mild diffuse gastritis and a few fundic gland polyps, biopsies negative for H. pylori.    Review of Systems Pertinent positive and negative review of systems were noted in the above HPI section.  All other review of systems was otherwise negative.   Outpatient Encounter Medications as of 05/05/2022  Medication Sig   ALPRAZolam (XANAX) 0.25 MG tablet Take 0.25 mg by mouth at bedtime.   dicyclomine (BENTYL) 20 MG tablet Take 1 tablet (20 mg total) by mouth  2 (two) times daily as needed for spasms.   docusate sodium (COLACE) 100 MG capsule Take 100 mg by mouth daily.   famotidine (PEPCID) 20 MG tablet Take 1 tablet (20 mg total) by mouth 2 (two) times daily.   Multiple Vitamin (MULTIVITAMIN WITH MINERALS) TABS tablet Take 2 tablets by mouth daily.   polyethylene glycol powder (GLYCOLAX/MIRALAX) 17 GM/SCOOP powder Take 17 g by mouth as needed.   valsartan (DIOVAN) 80 MG tablet Take 80 mg by mouth daily.   AMBULATORY NON FORMULARY MEDICATION 2 capsules daily. Beet Root   metoprolol tartrate (LOPRESSOR) 100 MG tablet Please take one tablet by mouth 2 hours prior to CT   Multiple Minerals-Vitamins (CAL-MAG-ZINC-D PO) Take 1-2 tablets by mouth daily.   nitroGLYCERIN (NITROSTAT) 0.4 MG SL tablet Place 1 tablet (0.4 mg total) under the tongue every 5 (five) minutes as needed for chest pain.   ondansetron (ZOFRAN) 4 MG tablet Take 4 mg by mouth as needed for nausea or vomiting. (Patient not taking: Reported on 02/10/2020)   Probiotic Product (ALIGN PO) Take 1 capsule by mouth daily.  (Patient not taking: Reported on 08/13/2021)   No facility-administered encounter medications on file as of 05/05/2022.   Allergies  Allergen Reactions   Sulfa Antibiotics Anaphylaxis   Erythromycin     Bloody stool   Lexapro [Escitalopram Oxalate]     "spacey" flu like symptoms    Nsaids     Bloody stool, abdominal pain   Pantoprazole Sodium Nausea Only   Sucralfate  Unknown reaction   Voltaren [Diclofenac Sodium] Other (See Comments)    Bloody stool, abdominal pain   Sudafed [Pseudoephedrine Hcl] Palpitations and Other (See Comments)    Dizziness, fainting    Patient Active Problem List   Diagnosis Date Noted   Hx of adenomatous colonic polyps 03/08/2018   Osteoarthritis of right knee 01/19/2018   S/P knee replacement 01/19/2018   Insomnia 11/04/2017   Anxiety    Colonic diverticular abscess 11/01/2017   Diverticulitis 10/29/2017   Impingement syndrome  of right shoulder region 06/16/2017   Irritable bowel syndrome 02/15/2017   Gastroesophageal reflux disease 11/10/2016   Diverticulosis of large intestine without hemorrhage 10/14/2015   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   Generalized anxiety disorder 05/08/2015   Goiter, 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   Social History   Socioeconomic History   Marital status: Widowed    Spouse name: Not on file   Number of children: Not on file   Years of education: Not on file   Highest education level: Not on file  Occupational History   Not on file  Tobacco Use   Smoking status: Former    Packs/day: 0.25    Years: 2.00    Total pack years: 0.50    Types: Cigarettes   Smokeless tobacco: Never   Tobacco comments:    Few cigarettes during her 'teen' years  Vaping Use   Vaping Use: Never used  Substance and Sexual Activity   Alcohol use: Yes    Comment: occas   Drug use: No   Sexual activity: Never  Other Topics Concern   Not on file  Social History Narrative   Not on file   Social Determinants of Health   Financial Resource Strain: Not on file  Food Insecurity: Not on file  Transportation Needs: Not on file  Physical Activity: Not on file  Stress: Not on file  Social Connections: Not on file  Intimate Partner Violence: Not on file    Alexandria Chapman's family history includes Alcohol abuse in her maternal uncle and mother; Bipolar disorder in her mother; Breast cancer in her cousin, mother, and paternal grandmother; Cancer in her father; Diabetes in her father; Drug abuse in her cousin; Heart disease in her father and mother; OCD in an other family member; Other in her brother; Prostate cancer in her father; Uterine cancer in her mother.      Objective:    Vitals:   05/05/22 1403  BP: 110/70  Pulse: 66    Physical Exam Well-developed well-nourished older in no acute distress.  Height, Weight, 131 BMI 21.8  HEENT; nontraumatic normocephalic, EOMI, PE R LA, sclera  anicteric. Oropharynx; not examined today Neck; supple, no JVD Cardiovascular; regular rate and rhythm with S1-S2, no murmur rub or gallop Pulmonary; Clear bilaterally Abdomen; soft, mild rather generalized tenderness, no guarding or rebound nondistended, no palpable mass or hepatosplenomegaly, bowel sounds are active Rectal; not done today Skin; benign exam, no jaundice rash or appreciable lesions Extremities; no clubbing cyanosis or edema skin warm and dry Neuro/Psych; alert and oriented x4, grossly nonfocal mood and affect appropriate        Assessment & Plan:   #56 77 year old female with history of IBS, and diverticular disease who comes in with 1 month history of new diarrhea, abdominal discomfort, nausea and significant fatigue.  Symptoms started a couple of days after she began a course of metronidazole topically for rosacea.  Relates that she had been intolerant to metronidazole orally in the past. She has  had some improvement in diarrhea now usually 2 loose bowel movements per day but other symptoms are persisting.  Fatigue/exhaustion persists.  Etiology of symptoms is not entirely clear, rule out exacerbation of IBS, rule out infectious etiologies i.e. C. difficile.,  Rule out prolonged viral syndrome  #2 history of adenomatous colon polyps-up-to-date with colonoscopy last done in October 2021 2 diminutive polyps removed from the rectum which were hyperplastic #3.  History of diverticulosis and prior diverticulitis #4.  GERD stable #5.  Congenital deafness  #6 hypertension #7 inflamed external hemorrhoid secondary to #1  Plan; she has been taking a probiotic, and will continue for now. Continue to push fluids, and bland diet CBC with differential, c-Met, sed rate, GI path panel and stool for C. difficile PCR. Refill hydrocortisone cream 2.5% use 2-3 times daily as needed for symptomatic external hemorrhoid. Offered an antiemetic which she does not feel she needs at this  time, use OTC Imodium as needed Further recommendations pending results of labs and stool studies. Check    Alexandria Chapman Genia Harold PA-C 05/05/2022   Cc: Vicenta Aly, [place]

## 2022-05-05 NOTE — Patient Instructions (Addendum)
Your provider has requested that you go to the basement level for lab work before leaving today. Press "B" on the elevator. The lab is located at the first door on the left as you exit the elevator.  Continue probiotics daily.   Push fluids.   We have sent the following medications to your pharmacy for you to pick up at your convenience: Hydrocortisone 2.5 % cream apply times 3-4 times daily for external hemorrhoids.   _______________________________________________________  If you are age 59 or older, your body mass index should be between 23-30. Your Body mass index is 21.84 kg/m. If this is out of the aforementioned range listed, please consider follow up with your Primary Care Provider.  If you are age 11 or younger, your body mass index should be between 19-25. Your Body mass index is 21.84 kg/m. If this is out of the aformentioned range listed, please consider follow up with your Primary Care Provider.   ________________________________________________________  The [place] [facility] providers would like to encourage you to use [facility] to communicate with providers for non-urgent requests or questions.  Due to long hold times on the telephone, sending your provider a message by [facility] may be a faster and more efficient way to get a response.  Please allow 48 business hours for a response.  Please remember that this is for non-urgent requests.  _______________________________________________________

## 2022-05-06 LAB — COMPREHENSIVE METABOLIC PANEL
ALT: 15 U/L (ref 0–35)
AST: 25 U/L (ref 0–37)
Albumin: 4.3 g/dL (ref 3.5–5.2)
Alkaline Phosphatase: 61 U/L (ref 39–117)
BUN: 14 mg/dL (ref 6–23)
CO2: 31 mEq/L (ref 19–32)
Calcium: 9.5 mg/dL (ref 8.4–10.5)
Chloride: 96 mEq/L (ref 96–112)
Creatinine, Ser: 0.78 mg/dL (ref 0.40–1.20)
GFR: 74.29 mL/min (ref >60.00)
Glucose, Bld: 90 mg/dL (ref 70–99)
Potassium: 4 mEq/L (ref 3.5–5.1)
Sodium: 134 mEq/L — ABNORMAL LOW (ref 135–145)
Total Bilirubin: 0.3 mg/dL (ref 0.2–1.2)
Total Protein: 7.6 g/dL (ref 6.0–8.3)

## 2022-05-10 ENCOUNTER — Other Ambulatory Visit: Payer: MEDICARE

## 2022-05-10 DIAGNOSIS — R1084 Generalized abdominal pain: Secondary | ICD-10-CM

## 2022-05-10 DIAGNOSIS — R531 Weakness: Secondary | ICD-10-CM

## 2022-05-10 DIAGNOSIS — R110 Nausea: Secondary | ICD-10-CM

## 2022-05-10 DIAGNOSIS — K644 Residual hemorrhoidal skin tags: Secondary | ICD-10-CM

## 2022-05-10 DIAGNOSIS — R197 Diarrhea, unspecified: Secondary | ICD-10-CM

## 2022-05-11 LAB — CLOSTRIDIUM DIFFICILE TOXIN B, QUALITATIVE, REAL-TIME PCR: Toxigenic C. Difficile by PCR: NOT DETECTED

## 2022-05-12 LAB — GI PROFILE, STOOL, PCR

## 2022-05-13 ENCOUNTER — Telehealth: Payer: Self-pay | Admitting: Physician Assistant

## 2022-05-13 NOTE — Telephone Encounter (Signed)
Inbound call from patient wanting to thank Amy for everything, she stated she is doing well and has all her test results.

## 2022-12-23 ENCOUNTER — Ambulatory Visit: Payer: MEDICARE | Admitting: Physician Assistant

## 2022-12-30 ENCOUNTER — Encounter: Payer: Self-pay | Admitting: Physician Assistant

## 2022-12-30 ENCOUNTER — Ambulatory Visit (INDEPENDENT_AMBULATORY_CARE_PROVIDER_SITE_OTHER): Payer: MEDICARE | Admitting: Physician Assistant

## 2022-12-30 VITALS — BP 100/80 | HR 56 | Wt 121.2 lb

## 2022-12-30 DIAGNOSIS — K582 Mixed irritable bowel syndrome: Secondary | ICD-10-CM

## 2022-12-30 DIAGNOSIS — K649 Unspecified hemorrhoids: Secondary | ICD-10-CM | POA: Diagnosis not present

## 2022-12-30 MED ORDER — DICYCLOMINE HCL 20 MG PO TABS: 20.0000 mg | ORAL_TABLET | Freq: Two times a day (BID) | ORAL | 5 refills | Status: DC | PRN

## 2022-12-30 MED ORDER — HYDROCORTISONE (PERIANAL) 2.5 % EX CREA: 1.0000 | TOPICAL_CREAM | Freq: Two times a day (BID) | CUTANEOUS | 1 refills | Status: AC

## 2022-12-30 NOTE — Progress Notes (Signed)
Chief Complaint: Abdominal pain and gas  HPI:    Alexandria Chapman is a 77 year old female with a past medical history as listed below including congenital deafness, squamous cell cancer, GERD, IBS and multiple others, known to Dr. Russella Dar, who was referred to me by Ladora Daniel, PA-C for a complaint of abdominal pain and gas.      October 2021 colonoscopy with 2 small sessile polyps removed from the rectum that were hyperplastic and noted to have multiple left colon diverticuli.    October 2021 EGD with mild diffuse gastritis and a few fundic gland polyps, biopsies negative for H. pylori.    05/05/2022 patient saw Mike Gip PA for complaints of diarrhea, abdominal pain nausea and fatigue after short course of Metronidazole and topical form for rosacea.  At times because she had a history of IBS with new diarrhea possibly an exacerbation of IBS, stool studies were ordered.  Refill Hydrocortisone cream for hemorrhoids.    05/05/2022 stool studies and labs returned mostly normal.  ESR minimally elevated 49.    Today, patient presents to clinic and tells me that she is planning to move back to [place] around March of next year.  This all is making her very anxious and stressed and acting up her IBS.  Apparently she has also lost about 10 pounds without really trying over the past couple of months.  Does tell me she has been tidying up her house and cleaning out cupboards excetra and working hard every day.  She is still eating her regular diet with no decrease in appetite.  Still radiates back-and-forth from constipation to diarrhea and all of this has brought up her hemorrhoids for which she typically uses Hydrocortisone ointment twice a day but has run out of this.  Still uses Dicyclomine as needed for cramping and this works well for her.  She would like a refill.  Symptoms are well-managed on her chronic medications.    Denies fever, chills or blood in her stool.  Past Medical History:  Diagnosis Date    Anemia    denies   Anxiety    Cancer (HCC)    RLE squamous cell removed   Cataract    Diverticulosis    GERD (gastroesophageal reflux disease)    Heart murmur    IBS (irritable bowel syndrome)    TMJ disorder involving articular disc abnormality     Past Surgical History:  Procedure Laterality Date   ABDOMINAL HYSTERECTOMY     APPENDECTOMY     CHOLECYSTECTOMY     COLONOSCOPY     september 2015 said she has them every 5 years   CYST EXCISION Right 10/06/2015   Procedure: RIGHT LONG FINGER EXCISION MUCOID CYST;  Surgeon: Betha Loa, MD;  Location: [facility];  Service: Orthopedics;  Laterality: Right;  Bier block   ESOPHAGOGASTRODUODENOSCOPY  2007   EYE SURGERY Bilateral 2022   KNEE ARTHROSCOPY     KNEE ARTHROSCOPY Right 06/28/2018   OOPHORECTOMY     PAROTID GLAND TUMOR EXCISION Left    SHOULDER ARTHROSCOPY Right    TONSILLECTOMY     TOTAL KNEE ARTHROPLASTY Right 01/19/2018   Procedure: RIGHT TOTAL KNEE ARTHROPLASTY;  Surgeon: Eugenia Mcalpine, MD;  Location: [facility];  Service: Orthopedics;  Laterality: Right;    Current Outpatient Medications  Medication Sig Dispense Refill   ALPRAZolam (XANAX) 0.25 MG tablet Take 0.25 mg by mouth at bedtime.     AMBULATORY NON FORMULARY MEDICATION 2 capsules daily. Beet  Root     dicyclomine (BENTYL) 20 MG tablet Take 1 tablet (20 mg total) by mouth 2 (two) times daily as needed for spasms. 60 tablet 3   docusate sodium (COLACE) 100 MG capsule Take 100 mg by mouth daily.     famotidine (PEPCID) 20 MG tablet Take 1 tablet (20 mg total) by mouth 2 (two) times daily. 60 tablet 3   metoprolol tartrate (LOPRESSOR) 100 MG tablet Please take one tablet by mouth 2 hours prior to CT 1 tablet 0   Multiple Minerals-Vitamins (CAL-MAG-ZINC-D PO) Take 1-2 tablets by mouth daily.     Multiple Vitamin (MULTIVITAMIN WITH MINERALS) TABS tablet Take 2 tablets by mouth daily.     nitroGLYCERIN (NITROSTAT) 0.4 MG SL tablet Place 1 tablet (0.4  mg total) under the tongue every 5 (five) minutes as needed for chest pain. 25 tablet 3   ondansetron (ZOFRAN) 4 MG tablet Take 4 mg by mouth as needed for nausea or vomiting. (Patient not taking: Reported on 02/10/2020)     polyethylene glycol powder (GLYCOLAX/MIRALAX) 17 GM/SCOOP powder Take 17 g by mouth as needed.     Probiotic Product (ALIGN PO) Take 1 capsule by mouth daily.  (Patient not taking: Reported on 08/13/2021)     valsartan (DIOVAN) 80 MG tablet Take 80 mg by mouth daily.     No current facility-administered medications for this visit.    Allergies as of 12/30/2022 - Review Complete 05/05/2022  Allergen Reaction Noted   Sulfa antibiotics Anaphylaxis 10/01/2015   Erythromycin  10/01/2015   Lexapro [escitalopram oxalate]  01/08/2018   Nsaids  10/01/2015   Pantoprazole sodium Nausea Only 01/08/2018   Sucralfate  10/01/2015   Voltaren [diclofenac sodium] Other (See Comments) 10/30/2017   Sudafed [pseudoephedrine hcl] Palpitations and Other (See Comments) 10/01/2015    Family History  Problem Relation Age of Onset   Alcohol abuse Mother    Bipolar disorder Mother    Breast cancer Mother    Uterine cancer Mother    Heart disease Mother    Alcohol abuse Maternal Uncle    Drug abuse Cousin    OCD Other    Breast cancer Paternal Grandmother    Breast cancer Cousin        mom side    Prostate cancer Father    Cancer Father        tonsils, heavy smoker   Diabetes Father    Heart disease Father    Other Brother        pacemake defibulator-2020   Colon cancer Neg Hx    Esophageal cancer Neg Hx     Social History   Socioeconomic History   Marital status: Widowed    Spouse name: Not on file   Number of children: Not on file   Years of education: Not on file   Highest education level: Not on file  Occupational History   Not on file  Tobacco Use   Smoking status: Former    Current packs/day: 0.25    Average packs/day: 0.3 packs/day for 2.0 years (0.5 ttl  pk-yrs)    Types: Cigarettes   Smokeless tobacco: Never   Tobacco comments:    Few cigarettes during her 'teen' years  Vaping Use   Vaping status: Never Used  Substance and Sexual Activity   Alcohol use: Yes    Comment: occas   Drug use: No   Sexual activity: Never  Other Topics Concern   Not on file  Social History Narrative  Not on file   Social Determinants of Health   Financial Resource Strain: Low Risk  (11/22/2022)   [facility]   Overall Financial Resource Strain (CARDIA)    Difficulty of Paying Living Expenses: Not hard at all  Food Insecurity: No Food Insecurity (11/22/2022)   Received from [facility]   Hunger Vital Sign    Worried About Running Out of Food in the Last Year: Never true    Ran Out of Food in the Last Year: Never true  Transportation Needs: No Transportation Needs (11/22/2022)   Received from [facility] - Transportation    Lack of Transportation (Medical): No    Lack of Transportation (Non-Medical): No  Physical Activity: Sufficiently Active (11/22/2022)   [facility] from [facility]   Exercise Vital Sign    Days of Exercise per Week: 7 days    Minutes of Exercise per Session: 60 min  Stress: No Stress Concern Present (11/22/2022)   Received from [facility] - Occupational Stress Questionnaire    Feeling of Stress : Not at all  Social Connections: Moderately Integrated (11/22/2022)   Received from [facility]   Social Network    How would you rate your social network (family, work, friends)?: Adequate participation with social networks  Intimate Partner Violence: Unknown (07/26/2021)   Received from [employer]   HITS    Physically Hurt: Not on file    Insult or Talk Down To: Not on file    Threaten Physical Harm: Not on file    Scream or Curse: Not on file    Review of Systems:    Constitutional: No weight loss, fever or chills Cardiovascular: No  chest pain Respiratory: No SOB  Gastrointestinal: See HPI and otherwise negative   Physical Exam:  Vital signs: BP 100/80 (BP Location: Left Arm, Patient Position: Sitting, Cuff Size: Normal)   Pulse (!) 56 Comment: irregular  Wt 121 lb 4 oz (55 kg)   BMI 20.18 kg/m    Constitutional:   Pleasant thin appearing Caucasian female appears to be in NAD, Well developed, Well nourished, alert and cooperative Respiratory: Respirations even and unlabored. Lungs clear to auscultation bilaterally.   No wheezes, crackles, or rhonchi.  Cardiovascular: Normal S1, S2. No MRG. Regular rate and rhythm. No peripheral edema, cyanosis or pallor.  Gastrointestinal:  Soft, nondistended, nontender. No rebound or guarding. Normal bowel sounds. No appreciable masses or hepatomegaly. Rectal:  Not performed.  Psychiatric: Demonstrates good judgement and reason without abnormal affect or behaviors.  RELEVANT LABS AND IMAGING: CBC    Component Value Date/Time   WBC 6.8 05/05/2022 1647   RBC 4.82 05/05/2022 1647   HGB 13.7 05/05/2022 1647   HCT 41.6 05/05/2022 1647   PLT 269.0 05/05/2022 1647   MCV 86.3 05/05/2022 1647   MCH 28.2 06/09/2020 0956   MCHC 33.0 05/05/2022 1647   RDW 13.1 05/05/2022 1647   LYMPHSABS 1.5 05/05/2022 1647   MONOABS 0.8 05/05/2022 1647   EOSABS 0.1 05/05/2022 1647   BASOSABS 0.0 05/05/2022 1647    CMP     Component Value Date/Time   NA 134 (L) 05/05/2022 1647   NA 137 08/13/2021 1646   K 4.0 05/05/2022 1647   CL 96 05/05/2022 1647   CO2 31 05/05/2022 1647   GLUCOSE 90 05/05/2022 1647   BUN 14 05/05/2022 1647   BUN 14 08/13/2021 1646   CREATININE 0.78 05/05/2022 1647  CALCIUM 9.5 05/05/2022 1647   PROT 7.6 05/05/2022 1647   ALBUMIN 4.3 05/05/2022 1647   AST 25 05/05/2022 1647   ALT 15 05/05/2022 1647   ALKPHOS 61 05/05/2022 1647   BILITOT 0.3 05/05/2022 1647   GFRNONAA >60 06/09/2020 0956   GFRAA >60 05/28/2018 1543    Assessment: 1.  IBS-mixed: With current  flare given increase in stress and her impending move back to [place], describes lower abdominal cramping well-controlled with Dicyclomine as needed and hemorrhoids now for which she would like a refill of hydrocortisone 2.  Hemorrhoids: Previously treated with chart cortisone ointment with good success 3.  Weight loss: Likely from increased activity and stress  Plan: 1.  Refill Dicyclomine 20 mg twice daily as needed for cramping #60 with 5 refills. 2.  Refill Hydrocortisone ointment twice daily x 7 days with 1 refill 3.  Patient to follow in clinic with [place] as needed prior to her move to [place]. 4.  Encouraged patient to call us if she keeps losing weight regardless of eating more calories  Hyacinth Meeker, PA-C [facility] 12/30/2022, 11:05 AM  Cc: Ladora Daniel, PA-C

## 2022-12-30 NOTE — Patient Instructions (Addendum)
_______________________________________________________  If your blood pressure at your visit was 140/90 or greater, please contact your primary care physician to follow up on this.  _______________________________________________________  If you are age 76 or older, your body mass index should be between 23-30. Your Body mass index is 20.18 kg/m. If this is out of the aforementioned range listed, please consider follow up with your Primary Care Provider.  If you are age 2 or younger, your body mass index should be between 19-25. Your Body mass index is 20.18 kg/m. If this is out of the aformentioned range listed, please consider follow up with your Primary Care Provider.   ________________________________________________________  The [facility] providers would like to encourage you to use [facility] to communicate with providers for non-urgent requests or questions.  Due to long hold times on the telephone, sending your provider a message by [facility] may be a faster and more efficient way to get a response.  Please allow 48 business hours for a response.  Please remember that this is for non-urgent requests.  _______________________________________________________  We have sent the following medications to your pharmacy for you to pick up at your convenience: Bentyl Hydrocortisone  Follow up as needed  It was a pleasure to see you today!  Thank you for trusting me with your gastrointestinal care!

## 2023-03-27 ENCOUNTER — Ambulatory Visit: Payer: MEDICARE | Admitting: Physician Assistant

## 2023-03-27 ENCOUNTER — Telehealth: Payer: Self-pay | Admitting: Physician Assistant

## 2023-03-27 NOTE — Telephone Encounter (Signed)
It was definitely sent on 12-30-2022 to [facility] with 1 refill. She never picked up the medication and the pharmacist said it will be ready after 2pm and that should hopefully be 2 dollars. LVM for patient to call back so she can know

## 2023-08-14 ENCOUNTER — Other Ambulatory Visit: Payer: Self-pay | Admitting: Physician Assistant

## 2024-01-24 IMAGING — CT CT HEART MORP W/ CTA COR W/ SCORE W/ CA W/CM &/OR W/O CM
4 of 7 series · 8 of 20 positions shown, 9 images · IV contrast (APPLIED)
Comparison: None Available.
COMPARISON: None Available.

Addendum:
EXAM:
OVER-READ INTERPRETATION  CT CHEST

The following report is an over-read performed by radiologist Dr.
Bruno Vartanian [REDACTED] on 08/31/2021. This
over-read does not include interpretation of cardiac or coronary
anatomy or pathology. The coronary calcium score/coronary CTA
interpretation by the cardiologist is attached.
HISTORY: Chest pain, nonspecific
Chest tightness
Cardiac/Coronary  CT
TECHNIQUE: The patient was scanned on a Siemens Force scanner.
PROTOCOL: A 120 kV prospective scan was triggered in the descending thoracic
aorta at 111 HU's. Axial non-contrast 3 mm slices were carried out
through the heart. The data set was analyzed on a dedicated work
station and scored using the Agatston method. Gantry rotation speed
was 250 msecs and collimation was .6 mm. Beta blockade and 0.8 mg of
sl NTG was given. The 3D data set was reconstructed in 5% intervals
of the 35-75 % of the R-R cycle. Systolic and diastolic phases were
analyzed on a dedicated work station using MPR, MIP and VRT modes.
The patient received 100mL OMNIPAQUE IOHEXOL 350 MG/ML SOLN
contrast.

[Series 6: ts diast sharp · axial · 0.39mm/px · z∈[+1102,+1144]mm · 2 of 316 slices shown]
[im 106/316  lung]
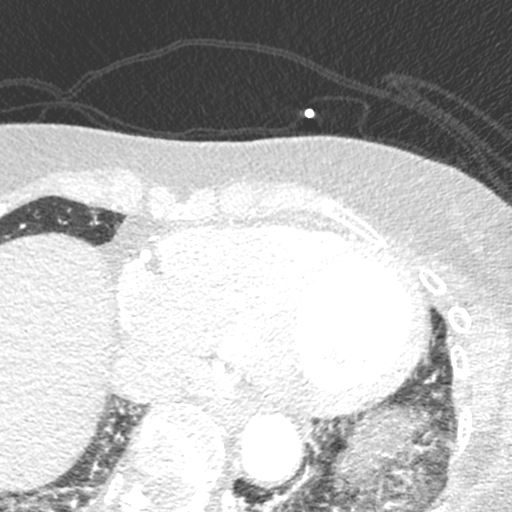
[im 211/316  lung]
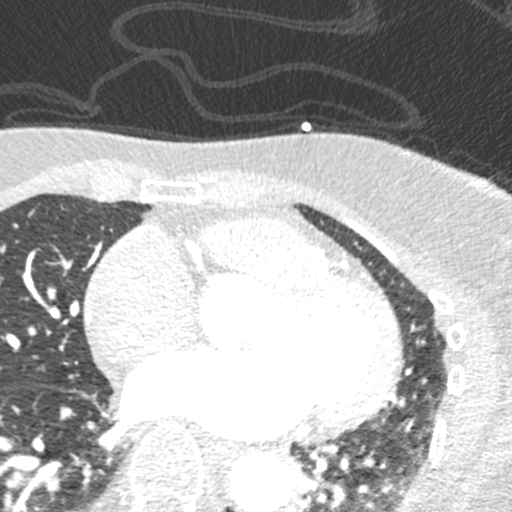

[Series 7: ts syst sharp · axial · 0.39mm/px · z∈[+1102,+1144]mm · 2 of 316 slices shown]
[im 106/316  lung]
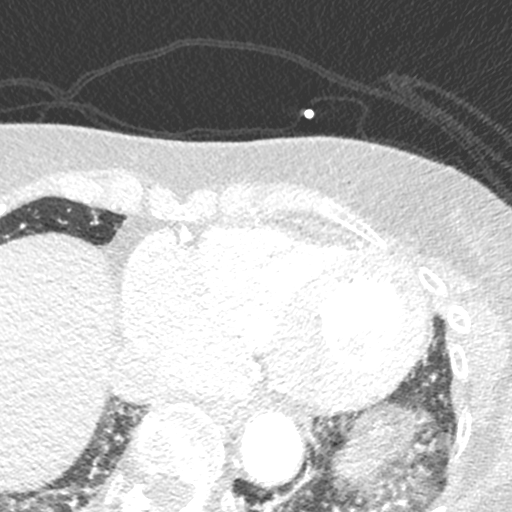
[im 211/316  lung]
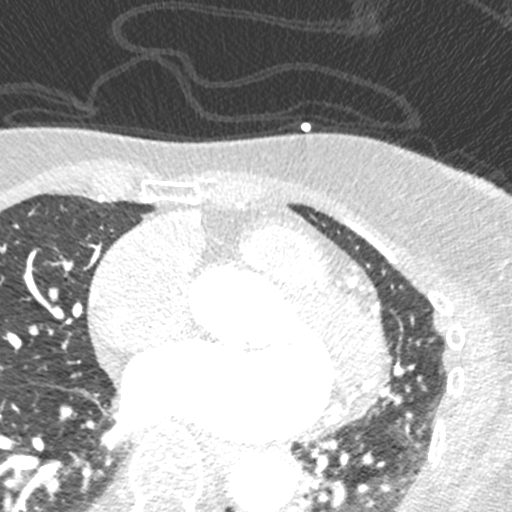

[Series 8: best syst · axial · 0.39mm/px · z∈[+1102,+1143]mm · 2 of 313 slices shown, 3 images]
[im 105/313  vessel]
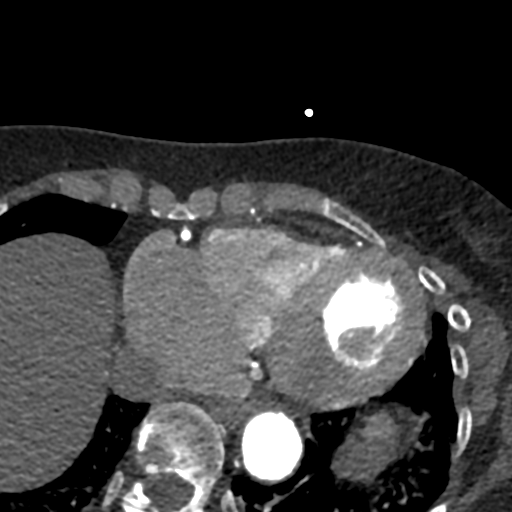
[im 105/313  lung]
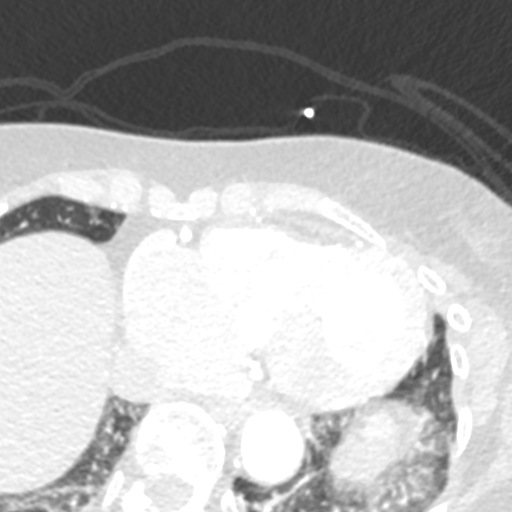
[im 209/313  vessel]
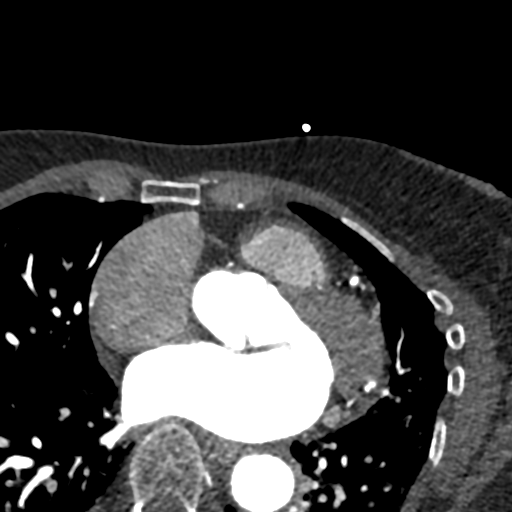

[Series 9: best diast · axial · 0.39mm/px · z∈[+1102,+1144]mm · 2 of 316 slices shown]
[im 106/316  vessel]
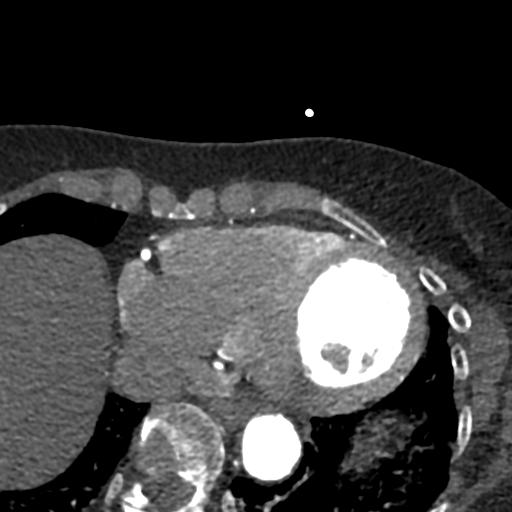
[im 211/316  vessel]
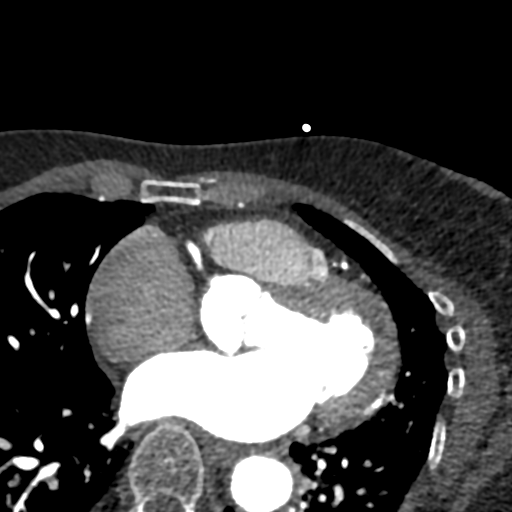

[8 of 20 positions shown; findings below may reference images not displayed]

FINDINGS: Vascular: Normal heart size. No pericardial effusion. Normal caliber
thoracic aorta with mild atherosclerotic disease.

Mediastinum/Nodes: Esophagus is unremarkable. No pathologically
enlarged lymph nodes seen in the chest.

Lungs/Pleura: Central airways are patent. No consolidation, pleural
effusion or pneumothorax.

Upper Abdomen: Low-density lesion of the right hepatic dome, likely
a simple cyst. No acute abnormalities.

Musculoskeletal: No chest wall mass or suspicious bone lesions
identified.
IMPRESSION: No acute extra cardiac abnormality.
FINDINGS: Image quality: Good

Noise artifact is: Limited

Coronary calcium score is 0.

Coronary arteries: Normal coronary origins.  Right dominance.

Right Coronary Artery: Minimal atherosclerotic plaque in the ostial
RCA, <25% stenosis.

Left Main Coronary Artery: No detectable plaque or stenosis.

Left Anterior Descending Coronary Artery: No detectable plaque or
stenosis.

Left Circumflex Artery: No detectable plaque or stenosis.

Aorta: Normal size, 33 mm at the mid ascending aorta (level of the
PA bifurcation) measured double oblique. No calcifications. No
dissection.

Aortic Valve: No calcifications.

Other findings:

Normal pulmonary vein drainage into the left atrium.

Normal left atrial appendage without thrombus. Chicken wing
morphology.

Normal size of the pulmonary artery.
IMPRESSION: 1. Minimal CAD, CADRADS = 1.

2. Coronary calcium score of 0.

3. Normal coronary origins with right dominance.

*** End of Addendum ***
EXAM:
OVER-READ INTERPRETATION  CT CHEST

The following report is an over-read performed by radiologist Dr.
Bruno Vartanian [REDACTED] on 08/31/2021. This
over-read does not include interpretation of cardiac or coronary
anatomy or pathology. The coronary calcium score/coronary CTA
interpretation by the cardiologist is attached.
FINDINGS: Vascular: Normal heart size. No pericardial effusion. Normal caliber
thoracic aorta with mild atherosclerotic disease.

Mediastinum/Nodes: Esophagus is unremarkable. No pathologically
enlarged lymph nodes seen in the chest.

Lungs/Pleura: Central airways are patent. No consolidation, pleural
effusion or pneumothorax.

Upper Abdomen: Low-density lesion of the right hepatic dome, likely
a simple cyst. No acute abnormalities.

Musculoskeletal: No chest wall mass or suspicious bone lesions
identified.
IMPRESSION: No acute extra cardiac abnormality.

## 2024-02-14 ENCOUNTER — Ambulatory Visit: Payer: MEDICARE | Admitting: Gastroenterology

## 2024-04-16 ENCOUNTER — Ambulatory Visit: Payer: MEDICARE | Admitting: Gastroenterology

## 2024-04-16 NOTE — Progress Notes (Deleted)
 "  Chief Complaint: Primary GI MD: Dr. Aneita  HPI:  Alexandria Chapman is a 78 year old female with a past medical history as listed below including congenital deafness, squamous cell cancer, GERD, IBS and multiple others, known to Dr. Aneita, who presents for    October 2021 colonoscopy with 2 small sessile polyps removed from the rectum that were hyperplastic and noted to have multiple left colon diverticuli.    October 2021 EGD with mild diffuse gastritis and a few fundic gland polyps, biopsies negative for H. pylori.    05/05/2022 patient saw Greig Corti PA for complaints of diarrhea, abdominal pain nausea and fatigue after short course of Metronidazole  and topical form for rosacea.  At times because she had a history of IBS with new diarrhea possibly an exacerbation of IBS, stool studies were ordered.  Refill Hydrocortisone  cream for hemorrhoids.    05/05/2022 stool studies and labs returned mostly normal.  ESR minimally elevated 49.   12/2022 visit with Delon Failing, PA for IBS mixed type with impending move back to [place]  and patient was given dicyclomine    Discussed the use of AI scribe software for clinical note transcription with the patient, who gave verbal consent to proceed.  History of Present Illness      PREVIOUS GI WORKUP     Past Medical History:  Diagnosis Date   Anemia    denies   Anxiety    Cancer (HCC)    RLE squamous cell removed   Cataract    Diverticulosis    GERD (gastroesophageal reflux disease)    Heart murmur    IBS (irritable bowel syndrome)    TMJ disorder involving articular disc abnormality     Past Surgical History:  Procedure Laterality Date   ABDOMINAL HYSTERECTOMY     APPENDECTOMY     CATARACT EXTRACTION, BILATERAL Bilateral 2022   CHOLECYSTECTOMY     COLONOSCOPY     september 2015 said she has them every 5 years   CYST EXCISION Right 10/06/2015   Procedure: RIGHT LONG FINGER EXCISION MUCOID CYST;  Surgeon: Franky Curia, MD;   Location:  [facility];  Service: Orthopedics;  Laterality: Right;  Bier block   ESOPHAGOGASTRODUODENOSCOPY  2007   KNEE ARTHROSCOPY     KNEE ARTHROSCOPY Right 06/28/2018   OOPHORECTOMY     PAROTID GLAND TUMOR EXCISION Left    SHOULDER ARTHROSCOPY Right    TONSILLECTOMY     TOTAL KNEE ARTHROPLASTY Right 01/19/2018   Procedure: RIGHT TOTAL KNEE ARTHROPLASTY;  Surgeon: Gerome Charleston, MD;  Location: [facility];  Service: Orthopedics;  Laterality: Right;    Current Outpatient Medications  Medication Sig Dispense Refill   ALPRAZolam  (XANAX ) 0.25 MG tablet Take 0.25 mg by mouth at bedtime.     dicyclomine  (BENTYL ) 20 MG tablet TAKE 1 TABLET (20 MG TOTAL) BY MOUTH 2 (TWO) TIMES DAILY AS NEEDED FOR SPASMS. 60 tablet 5   docusate sodium  (COLACE) 100 MG capsule Take 100 mg by mouth daily.     famotidine  (PEPCID ) 20 MG tablet Take 1 tablet (20 mg total) by mouth 2 (two) times daily. 60 tablet 3   hydrocortisone  (ANUSOL -HC) 2.5 % rectal cream Place 1 Application rectally 2 (two) times daily. For 7 to 14 days 30 g 1   Multiple Minerals-Vitamins (CAL-MAG-ZINC-D PO) Take 1-2 tablets by mouth daily.     Multiple Vitamin (MULTIVITAMIN WITH MINERALS) TABS tablet Take 2 tablets by mouth daily.     polyethylene glycol powder (GLYCOLAX /MIRALAX ) 17 GM/SCOOP powder  Take 17 g by mouth as needed.     Probiotic Product (ALIGN PO) Take 1 capsule by mouth daily.     valsartan (DIOVAN) 80 MG tablet Take 80 mg by mouth daily.     No current facility-administered medications for this visit.    Allergies as of 04/16/2024 - Review Complete 12/30/2022  Allergen Reaction Noted   Sulfa antibiotics Anaphylaxis 10/01/2015   Erythromycin  10/01/2015   Lexapro  [escitalopram  oxalate]  01/08/2018   Nsaids  10/01/2015   Pantoprazole  sodium Nausea Only 01/08/2018   Sucralfate  10/01/2015   Voltaren [diclofenac sodium] Other (See Comments) 10/30/2017   Metronidazole  Diarrhea 04/10/2022   Sudafed [pseudoephedrine  hcl] Palpitations and Other (See Comments) 10/01/2015    Family History  Problem Relation Age of Onset   Alcohol abuse Mother    Bipolar disorder Mother    Breast cancer Mother    Uterine cancer Mother    Heart disease Mother    Alcohol abuse Maternal Uncle    Drug abuse Cousin    OCD Other    Breast cancer Paternal Grandmother    Breast cancer Cousin        mom side    Prostate cancer Father    Cancer Father        tonsils, heavy smoker   Diabetes Father    Heart disease Father    Other Brother        pacemake defibulator-2020   Colon cancer Neg Hx    Esophageal cancer Neg Hx     Social History   Socioeconomic History   Marital status: Widowed    Spouse name: Not on file   Number of children: Not on file   Years of education: Not on file   Highest education level: Not on file  Occupational History   Not on file  Tobacco Use   Smoking status: Former    Current packs/day: 0.25    Average packs/day: 0.3 packs/day for 2.0 years (0.5 ttl pk-yrs)    Types: Cigarettes   Smokeless tobacco: Never   Tobacco comments:    Few cigarettes during her 'teen' years  Vaping Use   Vaping status: Never Used  Substance and Sexual Activity   Alcohol use: Yes    Comment: occas   Drug use: No   Sexual activity: Never  Other Topics Concern   Not on file  Social History Narrative   Not on file   Social Drivers of Health   Tobacco Use: Low Risk (01/23/2024)   Received from [facility]   Patient History    Smoking Tobacco Use: Never    Smokeless Tobacco Use: Never    Passive Exposure: Never  Financial Resource Strain: Low Risk (01/23/2024)   Received from [facility]   Overall Financial Resource Strain (CARDIA)    How hard is it for you to pay for the very basics like food, housing, medical care, and heating?: Not hard at all  Food Insecurity: No Food Insecurity (01/23/2024)   Received from [facility]   Epic    Within the past 12 months, you worried that your food  would run out before you got the money to buy more.: Never true    Within the past 12 months, the food you bought just didn't last and you didn't have money to get more.: Never true  Transportation Needs: No Transportation Needs (01/23/2024)   Received from [facility]    In the past 12 months, has  lack of transportation kept you from medical appointments or from getting medications?: No    In the past 12 months, has lack of transportation kept you from meetings, work, or from getting things needed for daily living?: No  Physical Activity: Sufficiently Active (01/23/2024)   Received from [facility]   Exercise Vital Sign    On average, how many days per week do you engage in moderate to strenuous exercise (like a brisk walk)?: 7 days    On average, how many minutes do you engage in exercise at this level?: 100 min  Stress: No Stress Concern Present (01/23/2024)   Received from [facility] - Occupational Stress Questionnaire    Do you feel stress - tense, restless, nervous, or anxious, or unable to sleep at night because your mind is troubled all the time - these days?: Only a little  Social Connections: Socially Integrated (01/23/2024)   Received from [facility]   Social Network    How would you rate your social network (family, work, friends)?: Good participation with social networks  Intimate Partner Violence: Not At Risk (01/23/2024)   Received from [facility]   HITS    Over the last 12 months how often did your partner physically hurt you?: Never    Over the last 12 months how often did your partner insult you or talk down to you?: Never    Over the last 12 months how often did your partner threaten you with physical harm?: Never    Over the last 12 months how often did your partner scream or curse at you?: Never  Depression (PHQ2-9): Not on file  Alcohol Screen: Not on file  Housing: Low Risk (01/23/2024)   Received from  [facility]    In the last 12 months, was there a time when you were not able to pay the mortgage or rent on time?: No    In the past 12 months, how many times have you moved where you were living?: 0    At any time in the past 12 months, were you homeless or living in a shelter (including now)?: No  Utilities: Not At Risk (01/23/2024)   Received from [facility]    In the past 12 months has the electric, gas, oil, or water  company threatened to shut off services in your home?: No  Health Literacy: Not on file    Review of Systems:    Constitutional: No weight loss, fever, chills, weakness or fatigue HEENT: Eyes: No change in vision               Ears, Nose, Throat:  No change in hearing or congestion Skin: No rash or itching Cardiovascular: No chest pain, chest pressure or palpitations   Respiratory: No SOB or cough Gastrointestinal: See HPI and otherwise negative Genitourinary: No dysuria or change in urinary frequency Neurological: No headache, dizziness or syncope Musculoskeletal: No new muscle or joint pain Hematologic: No bleeding or bruising Psychiatric: No history of depression or anxiety    Physical Exam:  Vital signs: There were no vitals taken for this visit.  Constitutional: NAD, alert and cooperative Head:  Normocephalic and atraumatic. Eyes:   PEERL, EOMI. No icterus. Conjunctiva pink. Respiratory: Respirations even and unlabored. Lungs clear to auscultation bilaterally.   No wheezes, crackles, or rhonchi.  Cardiovascular:  Regular rate and rhythm. No peripheral edema, cyanosis or pallor.  Gastrointestinal:  Soft, nondistended,  nontender. No rebound or guarding. Normal bowel sounds. No appreciable masses or hepatomegaly. Rectal:  Declines Msk:  Symmetrical without gross deformities. Without edema, no deformity or joint abnormality.  Neurologic:  Alert and  oriented x4;  grossly normal neurologically.  Skin:   Dry and intact without significant  lesions or rashes. Psychiatric: Oriented to person, place and time. Demonstrates good judgement and reason without abnormal affect or behaviors.  Physical Exam    RELEVANT LABS AND IMAGING: CBC    Component Value Date/Time   WBC 6.8 05/05/2022 1647   RBC 4.82 05/05/2022 1647   HGB 13.7 05/05/2022 1647   HCT 41.6 05/05/2022 1647   PLT 269.0 05/05/2022 1647   MCV 86.3 05/05/2022 1647   MCH 28.2 06/09/2020 0956   MCHC 33.0 05/05/2022 1647   RDW 13.1 05/05/2022 1647   LYMPHSABS 1.5 05/05/2022 1647   MONOABS 0.8 05/05/2022 1647   EOSABS 0.1 05/05/2022 1647   BASOSABS 0.0 05/05/2022 1647    CMP     Component Value Date/Time   NA 134 (L) 05/05/2022 1647   NA 137 08/13/2021 1646   K 4.0 05/05/2022 1647   CL 96 05/05/2022 1647   CO2 31 05/05/2022 1647   GLUCOSE 90 05/05/2022 1647   BUN 14 05/05/2022 1647   BUN 14 08/13/2021 1646   CREATININE 0.78 05/05/2022 1647   CALCIUM 9.5 05/05/2022 1647   PROT 7.6 05/05/2022 1647   ALBUMIN 4.3 05/05/2022 1647   AST 25 05/05/2022 1647   ALT 15 05/05/2022 1647   ALKPHOS 61 05/05/2022 1647   BILITOT 0.3 05/05/2022 1647   GFRNONAA >60 06/09/2020 0956   GFRAA >60 05/28/2018 1543     Assessment/Plan:     History of colon polyps October 2021 colonoscopy with 2 small sessile polyps (hyperplastic) removed from the rectum that were hyperplastic and noted to have multiple left colon diverticuli. -- no recall due to age   [facility] 04/16/2024, 8:52 AM  Cc: Samie Frederick, PA-C "
